# Patient Record
Sex: Male | Born: 1940 | Race: White | Hispanic: No | Marital: Married | Smoking: Former smoker
Health system: Southern US, Community
[De-identification: ages and names within clinical notes are randomized; demographics above are authoritative.]

## PROBLEM LIST (undated history)

## (undated) DIAGNOSIS — I10 Essential (primary) hypertension: Secondary | ICD-10-CM

## (undated) DIAGNOSIS — E119 Type 2 diabetes mellitus without complications: Secondary | ICD-10-CM

## (undated) DIAGNOSIS — C801 Malignant (primary) neoplasm, unspecified: Secondary | ICD-10-CM

## (undated) DIAGNOSIS — E785 Hyperlipidemia, unspecified: Secondary | ICD-10-CM

## (undated) DIAGNOSIS — T7840XA Allergy, unspecified, initial encounter: Secondary | ICD-10-CM

## (undated) HISTORY — PX: CATARACT EXTRACTION, BILATERAL: SHX1313

## (undated) HISTORY — DX: Allergy, unspecified, initial encounter: T78.40XA

## (undated) HISTORY — DX: Essential (primary) hypertension: I10

## (undated) HISTORY — PX: PROSTATE SURGERY: SHX751

## (undated) HISTORY — PX: BLADDER SURGERY: SHX569

## (undated) HISTORY — PX: HERNIA REPAIR: SHX51

## (undated) HISTORY — DX: Hyperlipidemia, unspecified: E78.5

## (undated) HISTORY — DX: Malignant (primary) neoplasm, unspecified: C80.1

## (undated) HISTORY — DX: Type 2 diabetes mellitus without complications: E11.9

---

## 2009-11-12 ENCOUNTER — Ambulatory Visit (HOSPITAL_COMMUNITY): Admission: RE | Admit: 2009-11-12 | Discharge: 2009-11-12 | Payer: Self-pay | Admitting: Urology

## 2009-11-14 ENCOUNTER — Ambulatory Visit: Admission: RE | Admit: 2009-11-14 | Discharge: 2009-11-24 | Payer: Self-pay | Admitting: Radiation Oncology

## 2009-11-25 ENCOUNTER — Ambulatory Visit (HOSPITAL_BASED_OUTPATIENT_CLINIC_OR_DEPARTMENT_OTHER): Admission: RE | Admit: 2009-11-25 | Discharge: 2009-11-25 | Payer: Self-pay | Admitting: Urology

## 2010-01-05 ENCOUNTER — Inpatient Hospital Stay (HOSPITAL_COMMUNITY): Admission: RE | Admit: 2010-01-05 | Discharge: 2010-01-06 | Payer: Self-pay | Admitting: Urology

## 2010-01-05 ENCOUNTER — Encounter (INDEPENDENT_AMBULATORY_CARE_PROVIDER_SITE_OTHER): Payer: Self-pay | Admitting: Urology

## 2010-03-17 ENCOUNTER — Ambulatory Visit: Admission: RE | Admit: 2010-03-17 | Discharge: 2010-03-17 | Payer: Self-pay | Admitting: Radiation Oncology

## 2010-08-19 ENCOUNTER — Ambulatory Visit (HOSPITAL_COMMUNITY): Admission: RE | Admit: 2010-08-19 | Discharge: 2010-08-19 | Payer: Self-pay | Admitting: General Surgery

## 2011-02-25 LAB — GLUCOSE, CAPILLARY
Glucose-Capillary: 122 mg/dL — ABNORMAL HIGH (ref 70–99)
Glucose-Capillary: 134 mg/dL — ABNORMAL HIGH (ref 70–99)

## 2011-02-26 LAB — DIFFERENTIAL
Basophils Relative: 0 % (ref 0–1)
Eosinophils Absolute: 0.1 10*3/uL (ref 0.0–0.7)
Neutro Abs: 2.8 10*3/uL (ref 1.7–7.7)
Neutrophils Relative %: 55 % (ref 43–77)

## 2011-02-26 LAB — BASIC METABOLIC PANEL
Chloride: 108 mEq/L (ref 96–112)
GFR calc Af Amer: >60 mL/min (ref >60)
GFR calc non Af Amer: >60 mL/min (ref >60)
Potassium: 4 mEq/L (ref 3.5–5.1)
Sodium: 144 mEq/L (ref 135–145)

## 2011-02-26 LAB — CBC
Hemoglobin: 12.2 g/dL — ABNORMAL LOW (ref 13.0–17.0)
MCH: 29 pg (ref 26.0–34.0)
MCHC: 34.1 g/dL (ref 30.0–36.0)
Platelets: 167 10*3/uL (ref 150–400)
RDW: 15.7 % — ABNORMAL HIGH (ref 11.5–15.5)

## 2011-03-01 LAB — BASIC METABOLIC PANEL
BUN: 14 mg/dL (ref 6–23)
CO2: 32 mEq/L (ref 19–32)
Calcium: 8.9 mg/dL (ref 8.4–10.5)
Chloride: 105 mEq/L (ref 96–112)
Creatinine, Ser: 1.2 mg/dL (ref 0.4–1.5)
Glucose, Bld: 88 mg/dL (ref 70–99)

## 2011-03-01 LAB — CBC
Hemoglobin: 12.8 g/dL — ABNORMAL LOW (ref 13.0–17.0)
RBC: 4.34 MIL/uL (ref 4.22–5.81)
WBC: 5.8 10*3/uL (ref 4.0–10.5)

## 2011-03-01 LAB — GLUCOSE, CAPILLARY
Glucose-Capillary: 121 mg/dL — ABNORMAL HIGH (ref 70–99)
Glucose-Capillary: 135 mg/dL — ABNORMAL HIGH (ref 70–99)

## 2011-03-01 LAB — HEMOGLOBIN AND HEMATOCRIT, BLOOD
HCT: 31.8 % — ABNORMAL LOW (ref 39.0–52.0)
HCT: 33.5 % — ABNORMAL LOW (ref 39.0–52.0)
Hemoglobin: 11.3 g/dL — ABNORMAL LOW (ref 13.0–17.0)

## 2011-03-01 LAB — TYPE AND SCREEN: Antibody Screen: NEGATIVE

## 2011-03-01 LAB — ABO/RH: ABO/RH(D): O NEG

## 2011-03-16 LAB — GLUCOSE, CAPILLARY: Glucose-Capillary: 112 mg/dL — ABNORMAL HIGH (ref 70–99)

## 2011-03-16 LAB — POCT I-STAT 4, (NA,K, GLUC, HGB,HCT)
HCT: 40 % (ref 39.0–52.0)
Hemoglobin: 13.6 g/dL (ref 13.0–17.0)
Sodium: 144 mEq/L (ref 135–145)

## 2011-05-12 ENCOUNTER — Other Ambulatory Visit (HOSPITAL_COMMUNITY): Payer: Self-pay | Admitting: Urology

## 2011-05-12 ENCOUNTER — Other Ambulatory Visit: Payer: Self-pay | Admitting: Urology

## 2011-05-12 ENCOUNTER — Ambulatory Visit (HOSPITAL_COMMUNITY)
Admission: RE | Admit: 2011-05-12 | Discharge: 2011-05-12 | Disposition: A | Discharge status: Home / Self Care | Payer: Medicare Other | Source: Ambulatory Visit | Attending: Urology | Admitting: Urology

## 2011-05-12 ENCOUNTER — Encounter (HOSPITAL_COMMUNITY): Payer: Medicare Other

## 2011-05-12 DIAGNOSIS — Z01810 Encounter for preprocedural cardiovascular examination: Secondary | ICD-10-CM | POA: Insufficient documentation

## 2011-05-12 DIAGNOSIS — Z01818 Encounter for other preprocedural examination: Secondary | ICD-10-CM | POA: Insufficient documentation

## 2011-05-12 DIAGNOSIS — Z01812 Encounter for preprocedural laboratory examination: Secondary | ICD-10-CM | POA: Insufficient documentation

## 2011-05-12 LAB — CBC
HCT: 39.1 % (ref 39.0–52.0)
Hemoglobin: 12.7 g/dL — ABNORMAL LOW (ref 13.0–17.0)
MCV: 88.3 fL (ref 78.0–100.0)
RBC: 4.43 MIL/uL (ref 4.22–5.81)
RDW: 13.9 % (ref 11.5–15.5)
WBC: 5 10*3/uL (ref 4.0–10.5)

## 2011-05-12 LAB — BASIC METABOLIC PANEL
BUN: 21 mg/dL (ref 6–23)
CO2: 29 mEq/L (ref 19–32)
Chloride: 106 mEq/L (ref 96–112)
GFR calc non Af Amer: 56 mL/min — ABNORMAL LOW (ref >60)
Glucose, Bld: 105 mg/dL — ABNORMAL HIGH (ref 70–99)
Potassium: 4.7 mEq/L (ref 3.5–5.1)
Sodium: 141 mEq/L (ref 135–145)

## 2011-05-13 ENCOUNTER — Other Ambulatory Visit: Payer: Self-pay | Admitting: Urology

## 2011-05-13 ENCOUNTER — Ambulatory Visit (HOSPITAL_COMMUNITY)
Admission: RE | Admit: 2011-05-13 | Discharge: 2011-05-13 | Disposition: A | Discharge status: Home / Self Care | Payer: Medicare Other | Source: Ambulatory Visit | Attending: Urology | Admitting: Urology

## 2011-05-13 DIAGNOSIS — I10 Essential (primary) hypertension: Secondary | ICD-10-CM | POA: Insufficient documentation

## 2011-05-13 DIAGNOSIS — Z7982 Long term (current) use of aspirin: Secondary | ICD-10-CM | POA: Insufficient documentation

## 2011-05-13 DIAGNOSIS — E119 Type 2 diabetes mellitus without complications: Secondary | ICD-10-CM | POA: Insufficient documentation

## 2011-05-13 DIAGNOSIS — E7800 Pure hypercholesterolemia, unspecified: Secondary | ICD-10-CM | POA: Insufficient documentation

## 2011-05-13 DIAGNOSIS — C61 Malignant neoplasm of prostate: Secondary | ICD-10-CM | POA: Insufficient documentation

## 2011-05-13 DIAGNOSIS — R978 Other abnormal tumor markers: Secondary | ICD-10-CM | POA: Insufficient documentation

## 2011-05-13 DIAGNOSIS — Z8551 Personal history of malignant neoplasm of bladder: Secondary | ICD-10-CM | POA: Insufficient documentation

## 2011-05-13 DIAGNOSIS — Z79899 Other long term (current) drug therapy: Secondary | ICD-10-CM | POA: Insufficient documentation

## 2011-05-13 LAB — GLUCOSE, CAPILLARY
Glucose-Capillary: 131 mg/dL — ABNORMAL HIGH (ref 70–99)
Glucose-Capillary: 126 mg/dL — ABNORMAL HIGH (ref 70–99)

## 2011-05-17 NOTE — Op Note (Signed)
Isaac Stewart, Isaac Stewart              ACCOUNT NO.:  [id number]  MEDICAL RECORD NO.:  [id number]           PATIENT TYPE:  O  LOCATION:  DAYL                         FACILITY:  [facility]  PHYSICIAN:  Raynelle Bring, MD      DATE OF BIRTH:  04-Apr-1941  DATE OF PROCEDURE:  05/13/2011 DATE OF DISCHARGE:  05/13/2011                              OPERATIVE REPORT   PREOPERATIVE DIAGNOSES: 1. History of urothelial carcinoma of the bladder. 2. Positive urine tumor markers.  POSTOPERATIVE DIAGNOSES: 1. History of urothelial carcinoma of the bladder. 2. Positive urine tumor markers.  PROCEDURES PERFORMED: 1. Cystoscopy. 2. Bilateral retrograde pyelography with interpretation. 3. Site selected bladder biopsies.  INTRAOPERATIVE FINDINGS:  Bilateral retrograde pyelograms demonstrate no evidence of any filling defects within the renal collecting systems bilaterally or the ureters bilaterally.  There is no evidence of dilation or other abnormalities.  SURGEON:  Raynelle Bring, M.D.  ASSISTANT:  None.  ANESTHESIA:  General.  COMPLICATIONS:  None.  ESTIMATED BLOOD LOSS:  Minimal.  SPECIMENS: 1. Left bladder biopsy. 2. Posterior bladder biopsy. 3. Right bladder biopsy.  DISPOSITION OF SPECIMENS:  Pathology.  INDICATION:  Isaac Stewart is a 70 year old gentleman with a history of urothelial carcinoma of the bladder.  He recently presented for cystoscopic surveillance and was found to have an unremarkable cystoscopy; however, his bladder washing was performed and cytologic analysis revealed atypical cells and subsequent reflex FISH was positive indicating the possibility of recurrent urothelial carcinoma.  After discussing this finding, it was recommended that he proceed with further endoscopic evaluation and the above procedures.  The potential risks, complications and alternative treatment options were discussed in detail and informed consent obtained.  DESCRIPTION OF PROCEDURE:  The  patient was taken to the operating room and a general anesthetic was administered.  He was given preoperative antibiotics, placed in the dorsal lithotomy position and prepped and draped in the usual sterile fashion.  Next, a preoperative time-out was performed.  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  Inspection of the bladder was then systematically performed with both a 12- and 70-degree lens and revealed no evidence of any bladder tumors, stones or other mucosal pathology.  The ureteral orifices were identified in their expected anatomic locations and the left ureteral orifice was intubated with a 6-French ureteral catheter with Omnipaque contrast injected.  An identical procedure was performed on the contralateral side with findings dictated as above.  In summary, no abnormalities were noted in the renal collecting system or ureters bilaterally.  Attention then returned to the bladder and site selected biopsies were performed from the right lateral, posterior and left lateral bladder walls.  This was performed using a cold cup biopsy forceps.  The sites were then fulgurated with a Bugbee electrode and the bladder was emptied and reinspected.  Hemostasis remained excellent. The patient's bladder was then emptied and the procedure was ended.  He tolerated the procedure well without complications.  He was able to be transferred to the recovery unit in satisfactory condition.  Raynelle Bring, MD     LB/MEDQ  D:  05/13/2011  T:  05/13/2011  Job:  [id number]  Electronically Signed by Raynelle Bring MD on 05/17/2011 08:09:34 PM

## 2013-05-19 DIAGNOSIS — H66009 Acute suppurative otitis media without spontaneous rupture of ear drum, unspecified ear: Secondary | ICD-10-CM | POA: Insufficient documentation

## 2013-09-11 DIAGNOSIS — E782 Mixed hyperlipidemia: Secondary | ICD-10-CM | POA: Insufficient documentation

## 2013-09-11 DIAGNOSIS — E1122 Type 2 diabetes mellitus with diabetic chronic kidney disease: Secondary | ICD-10-CM | POA: Insufficient documentation

## 2014-08-06 ENCOUNTER — Ambulatory Visit (INDEPENDENT_AMBULATORY_CARE_PROVIDER_SITE_OTHER): Payer: Medicare Other | Admitting: Cardiovascular Disease

## 2014-08-06 ENCOUNTER — Encounter: Payer: Self-pay | Admitting: Cardiovascular Disease

## 2014-08-06 VITALS — BP 154/68 | HR 79 | Ht 72.0 in | Wt 210.0 lb

## 2014-08-06 DIAGNOSIS — I10 Essential (primary) hypertension: Secondary | ICD-10-CM

## 2014-08-06 NOTE — Patient Instructions (Signed)
Dr Gwenlyn Found will follow-up with you on an as needed basis.

## 2014-08-06 NOTE — Progress Notes (Signed)
Patient ID: Isaac Stewart, male   DOB: 1941/12/11, 73 y.o.   MRN: [id number]    OFFICE NOTE  Chief Complaint:  New patient visit for abnormal EKG  Primary Care Physician: Anselmo Pickler, DO  HPI:  Isaac Stewart is a 73 yo male with HTN, HLD, and diabetes type 2. Presents today for evaluation of abnormal EKG at primary care doctor. Was seen in there office about a month ago for his yearly physical exam and had an EKG that he was told did not have P waves. No chest pain, dyspnea, orthopnea, PND, light headedness, syncope, muscle aches, palpitations, nausea, vomiting, diarrhea, abdominal pain, numbness, or weakness. Does not check blood pressure at home. Last checked cholesterol and A1c one month ago. Was told these values were okay. Patient notes no history of MI or CAD or stroke. No prior stress tests or cardiac catheterizations. 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 No trouble with current activities.  States brother told him he stops breathing at night and he snores as well. No prior evaluation for sleep apnea. Notes he gets a good night sleep and feels well rested in the morning. Does not feel somnolent in the afternoon. Epworth sleepiness scale score of 5.    PMHx:  Past Medical History  Diagnosis Date  . HTN (hypertension)   . Diabetes     oral medications  . Hyperlipemia     Past Surgical History  Procedure Laterality Date  . Hernia repair    . Bladder surgery    . Prostate surgery      FAMHx:  Family History  Problem Relation Age of Onset  . Cancer Father   . Heart disease Maternal Grandfather     SOCHx:   reports that he quit smoking about 13 years ago. He does not have any smokeless tobacco history on file. He reports that he drinks alcohol. His drug history is not on file.  ALLERGIES:  No Known Allergies  ROS: A comprehensive review of systems was negative except for: apneic episodes at night  HOME MEDS: Current Outpatient Prescriptions    Medication Sig Dispense Refill  . amLODipine (NORVASC) 5 MG tablet Take 5 mg by mouth daily.      . APPLE CIDER VINEGAR PO Take 500 mg by mouth daily.      . fenofibrate 160 MG tablet Take 160 mg by mouth daily.      Marland Kitchen glimepiride (AMARYL) 2 MG tablet Take 2 mg by mouth daily.      Marland Kitchen loratadine (CLARITIN) 10 MG tablet Take 20 mg by mouth daily.      . metFORMIN (GLUCOPHAGE) 500 MG tablet Take 1,000 mg by mouth daily.      . Multiple Vitamin (MULTIVITAMIN) capsule Take 1 capsule by mouth daily.      . Omega-3 Fatty Acids (OMEGA-3 FISH OIL) 300 MG CAPS Take 500 mg by mouth daily.      . simvastatin (ZOCOR) 40 MG tablet Take 40 mg by mouth daily.       No current facility-administered medications for this visit.    LABS/IMAGING: No results found for this or any previous visit (from the past 48 hour(s)). No results found.  VITALS: BP 154/68  Pulse 79  Ht 6' (1.829 m)  Wt 210 lb (95.255 kg)  BMI 28.47 kg/m2  EXAM: General appearance: alert, cooperative and no distress Neck: no adenopathy, no carotid bruit, no JVD and supple, symmetrical, trachea midline Lungs: clear to auscultation bilaterally Heart:  regular rate and rhythm, S1, S2 normal, no murmur, click, rub or gallop Abdomen: soft, non-tender; bowel sounds normal; no masses,  no organomegaly Extremities: extremities normal, atraumatic, no cyanosis or edema Pulses: 2+ and symmetric radial pulses Skin: Skin color, texture, turgor normal. No rashes or lesions Neurologic: Grossly normal  EKG: NSR, rate 79  ASSESSMENT: 1. Normal EKG on previous assessment at PCP and today- no CAD or family history of MI or stroke. Risk factor management is reportedly under good control. 2. Hypertension- controlled 3. Reported apneic episodes-  No prior work up, though does not note any symptoms related to this.  PLAN: 1.   Patient with normal EKG today in the office. No need for further follow-up unless he develops cardiac symptoms. Follow-up  prn. 2.   Will continue amlodipine 5 mg daily for blood pressure control. 3.   Patient with no symptoms of sleep apnea. If he develops symptoms in the future he could get a sleep study through his PCP.    Tommi Rumps 08/06/2014, 11:10 AM   Agree with note by Dr. Caryl Bis.  The patient was referred for an abnormal EKG. In review of his EKG performed by his PCP and the one performed here they appear essentially normal. He does have well-controlled hypertension on amlodipine. His exam is benign. We will see him back when necessary  Lorretta Harp, M.D., [place], [facility], [place], [place] [address] [place], [place]  [id number]  [phone] 08/06/2014 12:12 PM

## 2014-08-12 ENCOUNTER — Encounter: Payer: Self-pay | Admitting: Cardiovascular Disease

## 2014-11-14 ENCOUNTER — Other Ambulatory Visit: Payer: Self-pay | Admitting: Nephrology

## 2014-11-14 DIAGNOSIS — I158 Other secondary hypertension: Secondary | ICD-10-CM

## 2014-11-14 DIAGNOSIS — N183 Chronic kidney disease, stage 3 unspecified: Secondary | ICD-10-CM

## 2014-11-25 ENCOUNTER — Ambulatory Visit
Admission: RE | Admit: 2014-11-25 | Discharge: 2014-11-25 | Disposition: A | Discharge status: Home / Self Care | Payer: Medicare Other | Source: Ambulatory Visit | Attending: Nephrology | Admitting: Nephrology

## 2014-11-25 DIAGNOSIS — N183 Chronic kidney disease, stage 3 unspecified: Secondary | ICD-10-CM

## 2014-11-25 DIAGNOSIS — I158 Other secondary hypertension: Secondary | ICD-10-CM

## 2017-01-07 DIAGNOSIS — C61 Malignant neoplasm of prostate: Secondary | ICD-10-CM | POA: Diagnosis not present

## 2017-01-14 DIAGNOSIS — Z8551 Personal history of malignant neoplasm of bladder: Secondary | ICD-10-CM | POA: Diagnosis not present

## 2017-01-14 DIAGNOSIS — Z8546 Personal history of malignant neoplasm of prostate: Secondary | ICD-10-CM | POA: Diagnosis not present

## 2017-05-25 DIAGNOSIS — E785 Hyperlipidemia, unspecified: Secondary | ICD-10-CM | POA: Diagnosis not present

## 2017-05-25 DIAGNOSIS — N183 Chronic kidney disease, stage 3 (moderate): Secondary | ICD-10-CM | POA: Diagnosis not present

## 2017-05-25 DIAGNOSIS — Z794 Long term (current) use of insulin: Secondary | ICD-10-CM | POA: Diagnosis not present

## 2017-05-25 DIAGNOSIS — I10 Essential (primary) hypertension: Secondary | ICD-10-CM | POA: Diagnosis not present

## 2017-05-25 DIAGNOSIS — E119 Type 2 diabetes mellitus without complications: Secondary | ICD-10-CM | POA: Diagnosis not present

## 2017-10-06 DIAGNOSIS — R69 Illness, unspecified: Secondary | ICD-10-CM | POA: Diagnosis not present

## 2017-10-17 DIAGNOSIS — R69 Illness, unspecified: Secondary | ICD-10-CM | POA: Diagnosis not present

## 2017-12-22 DIAGNOSIS — L989 Disorder of the skin and subcutaneous tissue, unspecified: Secondary | ICD-10-CM | POA: Diagnosis not present

## 2017-12-22 DIAGNOSIS — E785 Hyperlipidemia, unspecified: Secondary | ICD-10-CM | POA: Diagnosis not present

## 2017-12-22 DIAGNOSIS — E559 Vitamin D deficiency, unspecified: Secondary | ICD-10-CM | POA: Diagnosis not present

## 2017-12-22 DIAGNOSIS — I10 Essential (primary) hypertension: Secondary | ICD-10-CM | POA: Diagnosis not present

## 2017-12-22 DIAGNOSIS — E119 Type 2 diabetes mellitus without complications: Secondary | ICD-10-CM | POA: Diagnosis not present

## 2017-12-22 DIAGNOSIS — Z794 Long term (current) use of insulin: Secondary | ICD-10-CM | POA: Diagnosis not present

## 2018-01-25 DIAGNOSIS — Z8546 Personal history of malignant neoplasm of prostate: Secondary | ICD-10-CM | POA: Diagnosis not present

## 2018-02-01 DIAGNOSIS — Z8546 Personal history of malignant neoplasm of prostate: Secondary | ICD-10-CM | POA: Diagnosis not present

## 2018-02-01 DIAGNOSIS — Z8551 Personal history of malignant neoplasm of bladder: Secondary | ICD-10-CM | POA: Diagnosis not present

## 2018-03-13 DIAGNOSIS — E119 Type 2 diabetes mellitus without complications: Secondary | ICD-10-CM | POA: Diagnosis not present

## 2018-04-05 DIAGNOSIS — L821 Other seborrheic keratosis: Secondary | ICD-10-CM | POA: Diagnosis not present

## 2018-04-05 DIAGNOSIS — D0439 Carcinoma in situ of skin of other parts of face: Secondary | ICD-10-CM | POA: Diagnosis not present

## 2018-04-05 DIAGNOSIS — D0462 Carcinoma in situ of skin of left upper limb, including shoulder: Secondary | ICD-10-CM | POA: Diagnosis not present

## 2018-04-05 DIAGNOSIS — L570 Actinic keratosis: Secondary | ICD-10-CM | POA: Diagnosis not present

## 2018-04-05 DIAGNOSIS — X32XXXA Exposure to sunlight, initial encounter: Secondary | ICD-10-CM | POA: Diagnosis not present

## 2018-05-10 DIAGNOSIS — L570 Actinic keratosis: Secondary | ICD-10-CM | POA: Diagnosis not present

## 2018-05-10 DIAGNOSIS — Z08 Encounter for follow-up examination after completed treatment for malignant neoplasm: Secondary | ICD-10-CM | POA: Diagnosis not present

## 2018-05-10 DIAGNOSIS — Z85828 Personal history of other malignant neoplasm of skin: Secondary | ICD-10-CM | POA: Diagnosis not present

## 2018-05-10 DIAGNOSIS — X32XXXD Exposure to sunlight, subsequent encounter: Secondary | ICD-10-CM | POA: Diagnosis not present

## 2018-06-22 DIAGNOSIS — E785 Hyperlipidemia, unspecified: Secondary | ICD-10-CM | POA: Diagnosis not present

## 2018-06-22 DIAGNOSIS — E119 Type 2 diabetes mellitus without complications: Secondary | ICD-10-CM | POA: Diagnosis not present

## 2018-06-22 DIAGNOSIS — Z794 Long term (current) use of insulin: Secondary | ICD-10-CM | POA: Diagnosis not present

## 2018-06-22 DIAGNOSIS — Z23 Encounter for immunization: Secondary | ICD-10-CM | POA: Diagnosis not present

## 2018-06-22 DIAGNOSIS — E1121 Type 2 diabetes mellitus with diabetic nephropathy: Secondary | ICD-10-CM | POA: Diagnosis not present

## 2018-06-22 DIAGNOSIS — I10 Essential (primary) hypertension: Secondary | ICD-10-CM | POA: Diagnosis not present

## 2018-08-01 DIAGNOSIS — Z8546 Personal history of malignant neoplasm of prostate: Secondary | ICD-10-CM | POA: Diagnosis not present

## 2018-10-11 DIAGNOSIS — R69 Illness, unspecified: Secondary | ICD-10-CM | POA: Diagnosis not present

## 2018-10-18 DIAGNOSIS — D0462 Carcinoma in situ of skin of left upper limb, including shoulder: Secondary | ICD-10-CM | POA: Diagnosis not present

## 2018-10-23 DIAGNOSIS — R197 Diarrhea, unspecified: Secondary | ICD-10-CM | POA: Diagnosis not present

## 2018-10-23 DIAGNOSIS — Z1211 Encounter for screening for malignant neoplasm of colon: Secondary | ICD-10-CM | POA: Diagnosis not present

## 2018-11-14 ENCOUNTER — Encounter: Payer: Self-pay | Admitting: Gastroenterology

## 2018-12-04 ENCOUNTER — Other Ambulatory Visit: Payer: Self-pay

## 2018-12-04 ENCOUNTER — Encounter: Payer: Self-pay | Admitting: Gastroenterology

## 2018-12-04 ENCOUNTER — Ambulatory Visit (AMBULATORY_SURGERY_CENTER): Payer: Self-pay | Admitting: *Deleted

## 2018-12-04 VITALS — Ht 72.0 in | Wt 219.5 lb

## 2018-12-04 DIAGNOSIS — R195 Other fecal abnormalities: Secondary | ICD-10-CM

## 2018-12-04 MED ORDER — SUPREP BOWEL PREP KIT 17.5-3.13-1.6 GM/177ML PO SOLN: 1.0000 | Freq: Once | ORAL | 0 refills | Status: AC

## 2018-12-04 NOTE — Progress Notes (Signed)
No egg or soy allergy known to patient  No issues with past sedation with any surgeries  or procedures, no intubation problems  No diet pills per patient No home 02 use per patient  No blood thinners per patient  Pt denies issues with constipation  No A fib or A flutter  EMMI video sent to pt's e mail  

## 2018-12-18 ENCOUNTER — Encounter: Payer: Self-pay | Admitting: Gastroenterology

## 2018-12-18 ENCOUNTER — Ambulatory Visit (AMBULATORY_SURGERY_CENTER): Payer: Medicare HMO | Admitting: Gastroenterology

## 2018-12-18 VITALS — BP 166/88 | HR 69 | Resp 11

## 2018-12-18 DIAGNOSIS — D123 Benign neoplasm of transverse colon: Secondary | ICD-10-CM | POA: Diagnosis not present

## 2018-12-18 DIAGNOSIS — R195 Other fecal abnormalities: Secondary | ICD-10-CM

## 2018-12-18 DIAGNOSIS — D120 Benign neoplasm of cecum: Secondary | ICD-10-CM | POA: Diagnosis not present

## 2018-12-18 DIAGNOSIS — D125 Benign neoplasm of sigmoid colon: Secondary | ICD-10-CM

## 2018-12-18 MED ORDER — SODIUM CHLORIDE 0.9 % IV SOLN: 500.0000 mL | Freq: Once | INTRAVENOUS | Status: DC

## 2018-12-18 NOTE — Progress Notes (Signed)
Called to room to assist during endoscopic procedure.  Patient ID and intended procedure confirmed with present staff. Received instructions for my participation in the procedure from the performing physician.  

## 2018-12-18 NOTE — Op Note (Signed)
[place] [facility] Patient Name: Cass Vandermeulen Procedure Date: 12/18/2018 8:39 AM MRN: [id number] Endoscopist: [place]. Loletha Carrow , MD Age: 78 Referring MD:  Date of Birth: [birth year] Gender: Male Account #: [id number] Procedure:                Colonoscopy Indications:              Heme positive stool Medicines:                Monitored Anesthesia Care Procedure:                Pre-Anesthesia Assessment:                           - Prior to the procedure, a History and Physical                            was performed, and patient medications and                            allergies were reviewed. The patient's tolerance of                            previous anesthesia was also reviewed. The risks                            and benefits of the procedure and the sedation                            options and risks were discussed with the patient.                            All questions were answered, and informed consent                            was obtained. Prior Anticoagulants: The patient has                            taken no previous anticoagulant or antiplatelet                            agents. ASA Grade Assessment: II - A patient with                            mild systemic disease. After reviewing the risks                            and benefits, the patient was deemed in                            satisfactory condition to undergo the procedure.                           After obtaining informed consent, the colonoscope  was passed under direct vision. Throughout the                            procedure, the patient's blood pressure, pulse, and                            oxygen saturations were monitored continuously. The                            Model CF-HQ190L [id number]) scope was introduced                            through the anus and advanced to the the cecum,                            identified by appendiceal orifice and  ileocecal                            valve. The colonoscopy was performed without                            difficulty. The patient tolerated the procedure                            well. The quality of the bowel preparation was                            fair. The ileocecal valve, appendiceal orifice, and                            rectum were photographed. Scope In: 8:48:39 AM Scope Out: 9:30:15 AM Scope Withdrawal Time: 0 hours 36 minutes 38 seconds  Total Procedure Duration: 0 hours 41 minutes 36 seconds  Findings:                 A large skin tags were found on perianal exam.                           A 12 mm polyp was found in the cecum. The polyp was                            sessile. The polyp was removed with a piecemeal                            technique using a cold snare. Resection and                            retrieval were complete. (Jar 1)                           A 6 mm polyp was found in the mid transverse colon.                            The  polyp was flat. The polyp was removed with a                            piecemeal technique using a cold biopsy forceps.                            Resection and retrieval were complete. (Jar 2)                           Three sessile polyps were found in the distal                            transverse colon. The polyps were 5 to 10 mm in                            size. Two of these polyps were removed with a cold                            snare, and one removed with hot snare. Resection                            and retrieval were complete. (Jar 3)                           A partially obstructing mass was found in the                            sigmoid colon. The mass was partially                            circumferential (involving one-half of the lumen                            circumference). The mass measured approximately                            five cm in length (24-30cm from anal verge). This                             was biopsied with a cold forceps for histology.                            Area was tattooed with Spot (carbon black)- two 0.5                            ml injections on opposite walls about 3-5cm                            proximal to pass, and two 0.5/1.66ml injections on                            opposite walls about 3-5cm distal to the mass.  Internal hemorrhoids were found.                           The exam was otherwise without abnormality on                            direct and retroflexion views. Complications:            No immediate complications. Estimated Blood Loss:     Estimated blood loss was minimal. Impression:               - Preparation of the colon was fair.                           - Perianal skin tags found on perianal exam.                           - One 12 mm polyp in the cecum, removed piecemeal                            using a cold snare. Resected and retrieved.                           - One 6 mm polyp in the mid transverse colon,                            removed piecemeal using a cold biopsy forceps.                            Resected and retrieved.                           - Three 5 to 10 mm polyps in the distal transverse                            colon, removed with a hot snare. Resected and                            retrieved.                           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 Biopsied. Tattooed.                           - Internal hemorrhoids.                           - The examination was otherwise normal on direct                            and retroflexion views. Recommendation:           - Patient has a contact number available for  emergencies. The signs and symptoms of potential                            delayed complications were discussed with the                            patient. Return to  normal activities tomorrow.                            Written discharge instructions were provided to the                            patient.                           - Resume previous diet.                           - Continue present medications.                           - Await pathology results.                           - Repeat colonoscopy is recommended for                            surveillance. The colonoscopy date will be                            determined after pathology results from today's                            exam become available for review. Henry L. Loletha Carrow, MD 12/18/2018 9:41:25 AM This report has been signed electronically.

## 2018-12-18 NOTE — Progress Notes (Signed)
Pt. Awake, alert, denies any symptoms of low BS.  4 more oz. OJ, Atkins protein bar consumed in entirety, and PB on graham crackers.

## 2018-12-18 NOTE — Progress Notes (Signed)
Pt. Awake, alert, asymptomatic, BS 119.

## 2018-12-18 NOTE — Progress Notes (Signed)
To PACU, VSS, Report to [email or web address].tb

## 2018-12-18 NOTE — Patient Instructions (Addendum)
Impression/Recommendations:  Polyp handout given to patient. Hemorrhoid handout given to patient.  Resume previous diet. Continue present medications.  Repeat colonoscopy recommended for surveillance.   Date to be determined after pathology results reviewed.  YOU HAD AN ENDOSCOPIC PROCEDURE TODAY AT [facility]:   Refer to the procedure report that was given to you for any specific questions about what was found during the examination.  If the procedure report does not answer your questions, please call your gastroenterologist to clarify.  If you requested that your care partner not be given the details of your procedure findings, then the procedure report has been included in a sealed envelope for you to review at your convenience later.  YOU SHOULD EXPECT: Some feelings of bloating in the abdomen. Passage of more gas than usual.  Walking can help get rid of the air that was put into your GI tract during the procedure and reduce the bloating. If you had a lower endoscopy (such as a colonoscopy or flexible sigmoidoscopy) you may notice spotting of blood in your stool or on the toilet paper. If you underwent a bowel prep for your procedure, you may not have a normal bowel movement for a few days.  Please Note:  You might notice some irritation and congestion in your nose or some drainage.  This is from the oxygen used during your procedure.  There is no need for concern and it should clear up in a day or so.  SYMPTOMS TO REPORT IMMEDIATELY:   Following lower endoscopy (colonoscopy or flexible sigmoidoscopy):  Excessive amounts of blood in the stool  Significant tenderness or worsening of abdominal pains  Swelling of the abdomen that is new, acute  Fever of 100F or higher For urgent or emergent issues, a gastroenterologist can be reached at any hour by calling [phone].   DIET:  We do recommend a small meal at first, but then you may proceed to your regular diet.   Drink plenty of fluids but you should avoid alcoholic beverages for 24 hours.  ACTIVITY:  You should plan to take it easy for the rest of today and you should NOT DRIVE or use heavy machinery until tomorrow (because of the sedation medicines used during the test).    FOLLOW UP: Our staff will call the number listed on your records the next business day following your procedure to check on you and address any questions or concerns that you may have regarding the information given to you following your procedure. If we do not reach you, we will leave a message.  However, if you are feeling well and you are not experiencing any problems, there is no need to return our call.  We will assume that you have returned to your regular daily activities without incident.  If any biopsies were taken you will be contacted by phone or by letter within the next 1-3 weeks.  Please call us at [phone] if you have not heard about the biopsies in 3 weeks.    SIGNATURES/CONFIDENTIALITY: You and/or your care partner have signed paperwork which will be entered into your electronic medical record.  These signatures attest to the fact that that the information above on your After Visit Summary has been reviewed and is understood.  Full responsibility of the confidentiality of this discharge information lies with you and/or your care-partner.  ____________________________________________________________________________________________________________________________________   Dennis Bast have been scheduled for a CT scan of the abdomen and pelvis at [facility] ([address]---this is in the same building as Press [facility]).   You are scheduled on __________________ at _____________. You should arrive 15 minutes prior to your appointment time for registration. Please follow the written instructions below on the day of your exam:  WARNING: IF YOU ARE ALLERGIC TO IODINE/X-RAY DYE, PLEASE NOTIFY RADIOLOGY  IMMEDIATELY AT [phone]! YOU WILL BE GIVEN A 13 HOUR PREMEDICATION PREP.  1) Do not eat or drink anything after ______________ (4 hours prior to your test) 2) You have been given 2 bottles of oral contrast to drink. The solution may taste better if refrigerated, but do NOT add ice or any other liquid to this solution. Shake well before drinking.    Drink 1 bottle of contrast @ __________ (2 hours prior to your exam)  Drink 1 bottle of contrast @ __________ (1 hour prior to your exam)  You may take any medications as prescribed with a small amount of water, if necessary. If you take any of the following medications: METFORMIN, GLUCOPHAGE, GLUCOVANCE, AVANDAMET, RIOMET, FORTAMET, [place] MET, JANUMET, GLUMETZA or METAGLIP, you MAY be asked to HOLD this medication 48 hours AFTER the exam.  The purpose of you drinking the oral contrast is to aid in the visualization of your intestinal tract. The contrast solution may cause some diarrhea. Depending on your individual set of symptoms, you may also receive an intravenous injection of x-ray contrast/dye. Plan on being at [facility] for 30 minutes or longer, depending on the type of exam you are having performed.  This test typically takes 30-45 minutes to complete.  If you have any questions regarding your exam or if you need to reschedule, you may call the CT department at [phone] between the hours of 8:00 am and 5:00 pm, Monday-Friday.  ________________________________________________________________________

## 2018-12-19 ENCOUNTER — Telehealth: Payer: Self-pay

## 2018-12-19 NOTE — Telephone Encounter (Signed)
  Follow up Call-  Call back number 12/18/2018  Post procedure Call Back phone  # [phone]  Permission to leave phone message Yes  Some recent data might be hidden     Patient questions:  Do you have a fever, pain , or abdominal swelling? No. Pain Score  0 *  Have you tolerated food without any problems? Yes.    Have you been able to return to your normal activities? Yes.    Do you have any questions about your discharge instructions: Diet   No. Medications  No. Follow up visit  No.  Do you have questions or concerns about your Care? No.  Actions: * If pain score is 4 or above: No action needed, pain <4.

## 2018-12-19 NOTE — Telephone Encounter (Signed)
Call report from Dr. Saralyn Pilar with pathology cell # [phone].  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

## 2018-12-20 ENCOUNTER — Other Ambulatory Visit: Payer: Self-pay

## 2018-12-20 DIAGNOSIS — Z85828 Personal history of other malignant neoplasm of skin: Secondary | ICD-10-CM | POA: Diagnosis not present

## 2018-12-20 DIAGNOSIS — C189 Malignant neoplasm of colon, unspecified: Secondary | ICD-10-CM

## 2018-12-20 DIAGNOSIS — Z08 Encounter for follow-up examination after completed treatment for malignant neoplasm: Secondary | ICD-10-CM | POA: Diagnosis not present

## 2018-12-21 ENCOUNTER — Other Ambulatory Visit (INDEPENDENT_AMBULATORY_CARE_PROVIDER_SITE_OTHER): Payer: Medicare HMO

## 2018-12-21 DIAGNOSIS — C189 Malignant neoplasm of colon, unspecified: Secondary | ICD-10-CM | POA: Diagnosis not present

## 2018-12-21 LAB — COMPREHENSIVE METABOLIC PANEL
ALT: 16 U/L (ref 0–53)
AST: 13 U/L (ref 0–37)
Albumin: 3.7 g/dL (ref 3.5–5.2)
Alkaline Phosphatase: 45 U/L (ref 39–117)
BUN: 26 mg/dL — ABNORMAL HIGH (ref 6–23)
CO2: 29 mEq/L (ref 19–32)
Calcium: 9 mg/dL (ref 8.4–10.5)
Chloride: 105 mEq/L (ref 96–112)
Creatinine, Ser: 1.82 mg/dL — ABNORMAL HIGH (ref 0.40–1.50)
GFR: 38.67 mL/min — AB (ref >60.00)
Glucose, Bld: 201 mg/dL — ABNORMAL HIGH (ref 70–99)
Potassium: 4.8 mEq/L (ref 3.5–5.1)
Sodium: 139 mEq/L (ref 135–145)
Total Bilirubin: 0.4 mg/dL (ref 0.2–1.2)
Total Protein: 6.4 g/dL (ref 6.0–8.3)

## 2018-12-21 LAB — CBC WITH DIFFERENTIAL/PLATELET
Basophils Absolute: 0.1 10*3/uL (ref 0.0–0.1)
Basophils Relative: 1.3 % (ref 0.0–3.0)
Eosinophils Absolute: 0.4 10*3/uL (ref 0.0–0.7)
Eosinophils Relative: 5.7 % — ABNORMAL HIGH (ref 0.0–5.0)
HCT: 38.6 % — ABNORMAL LOW (ref 39.0–52.0)
Hemoglobin: 13 g/dL (ref 13.0–17.0)
Lymphocytes Relative: 29.4 % (ref 12.0–46.0)
Lymphs Abs: 1.9 10*3/uL (ref 0.7–4.0)
MCHC: 33.8 g/dL (ref 30.0–36.0)
MCV: 86.7 fl (ref 78.0–100.0)
Monocytes Absolute: 0.3 10*3/uL (ref 0.1–1.0)
Monocytes Relative: 5.3 % (ref 3.0–12.0)
Neutro Abs: 3.7 10*3/uL (ref 1.4–7.7)
Neutrophils Relative %: 58.3 % (ref 43.0–77.0)
Platelets: 242 10*3/uL (ref 150.0–400.0)
RBC: 4.45 Mil/uL (ref 4.22–5.81)
RDW: 14.3 % (ref 11.5–15.5)
WBC: 6.4 10*3/uL (ref 4.0–10.5)

## 2018-12-22 ENCOUNTER — Telehealth: Payer: Self-pay

## 2018-12-22 LAB — CEA: CEA: 0.5 ng/mL

## 2018-12-22 NOTE — Telephone Encounter (Signed)
It is a CT scan of the chest, abdomen and pelvis with oral contrast only

## 2018-12-22 NOTE — Telephone Encounter (Signed)
Isaac Rota, do you happen to know if he mentioned to change to without contrast? I'm looking online and not seeing the CPT codes changed.

## 2018-12-22 NOTE — Telephone Encounter (Signed)
-----   Message from Darden Dates sent at 12/22/2018  9:14 AM EST ----- Regarding: CT's pending denial- Need Dr. Loletha Carrow to call Darlina Guys, Need to call [phone] option #4 to set up peer to peer Insurance is pending denial right now for CT abd/pel/chest.  He is scheduled for Monday so will prob need to be r/s. Case# [id number] CPT coded need to be changed too.  XXXX They need to be changed to "without contrast" [id number] Chest WO and [place] while he is on the phone with him.  Marzetta Board just told me from [place].    They are denying because cancer confine to polyps.  I'm sure Dr. Loletha Carrow can clear that up with them. Just let me know where we are.   May need to give him a heads up on whats going on. Thanks a lot! Amy

## 2018-12-22 NOTE — Telephone Encounter (Signed)
Dr Loletha Carrow spoke to Dr Lacretia Nicks with the pre certification dept. Approved #[id number]

## 2018-12-25 ENCOUNTER — Ambulatory Visit (INDEPENDENT_AMBULATORY_CARE_PROVIDER_SITE_OTHER)
Admission: RE | Admit: 2018-12-25 | Discharge: 2018-12-25 | Disposition: A | Discharge status: Home / Self Care | Payer: Medicare HMO | Source: Ambulatory Visit | Attending: Gastroenterology | Admitting: Gastroenterology

## 2018-12-25 DIAGNOSIS — E1121 Type 2 diabetes mellitus with diabetic nephropathy: Secondary | ICD-10-CM | POA: Diagnosis not present

## 2018-12-25 DIAGNOSIS — C189 Malignant neoplasm of colon, unspecified: Secondary | ICD-10-CM | POA: Insufficient documentation

## 2018-12-25 DIAGNOSIS — E119 Type 2 diabetes mellitus without complications: Secondary | ICD-10-CM | POA: Diagnosis not present

## 2018-12-25 DIAGNOSIS — I10 Essential (primary) hypertension: Secondary | ICD-10-CM | POA: Diagnosis not present

## 2018-12-25 DIAGNOSIS — Z794 Long term (current) use of insulin: Secondary | ICD-10-CM | POA: Diagnosis not present

## 2018-12-25 DIAGNOSIS — Z8551 Personal history of malignant neoplasm of bladder: Secondary | ICD-10-CM | POA: Insufficient documentation

## 2018-12-25 DIAGNOSIS — E785 Hyperlipidemia, unspecified: Secondary | ICD-10-CM | POA: Diagnosis not present

## 2018-12-25 DIAGNOSIS — Z8546 Personal history of malignant neoplasm of prostate: Secondary | ICD-10-CM | POA: Insufficient documentation

## 2018-12-25 NOTE — Telephone Encounter (Signed)
This is why I asked about changing codes with [employer] while you had them on the phone on Friday:  Notes recorded by Marlon Pel, RN on 12/21/2018 at 4:24 PM EST I spoke with Erline Levine in [place]. Orders changed to without IV contrast Wife verbalized understanding to continue with orders for oral contrast She verbalized understanding. ------  Notes recorded by Doran Stabler, MD on 12/21/2018 at 1:27 PM EST This patient with newly diagnosed colon cancer has an elevated creatinine,and is schedule for a staging CT scan chest/abdomen/pelvis on 12/25/2018.  Please contact radiology and change order so that this study is done with oral contrast only. No IV contrast due to elevated creatinine.  I will call them this morning and see if they will allow me to change it.  He is scheduled this afternoon.  I will let you know if I have any issues.  Thanks, Amy

## 2018-12-25 NOTE — Telephone Encounter (Signed)
Spoke to [place] in [place]. Approval codes given and order changed to oral contrast only.

## 2018-12-25 NOTE — Telephone Encounter (Signed)
Please attend to this question

## 2018-12-26 DIAGNOSIS — C189 Malignant neoplasm of colon, unspecified: Secondary | ICD-10-CM

## 2019-01-01 ENCOUNTER — Ambulatory Visit: Payer: Self-pay | Admitting: General Surgery

## 2019-01-01 DIAGNOSIS — K6389 Other specified diseases of intestine: Secondary | ICD-10-CM | POA: Diagnosis not present

## 2019-01-02 ENCOUNTER — Ambulatory Visit: Payer: Self-pay | Admitting: General Surgery

## 2019-01-02 NOTE — H&P (Unsigned)
History of Present Illness Isaac Ruff MD; 1/61/0960 10:10 AM) The patient is a 78 year old male who presents with a colonic mass. 78 year old male with uncontrolled hypertension and history of prostate cancer who presents to the office for evaluation of a newly diagnosed colon mass. He was noted to have a positive fecal occult blood test and colonoscopy was performed. This showed multiple polyps as well as a sigmoid colon mass which was biopsied. 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 CEA was normal. CT scan showed no signs of metastatic disease but these were done with out contrast due to an elevated creatinine of 1.8.   Problem List/Past Medical Mammie Lorenzo, LPN; 4/54/0981 1:91 AM) MASS OF COLON [phone])  Past Surgical History Mammie Lorenzo, LPN; 5/62/1308 6:57 AM) Cataract Surgery Bilateral. Colon Polyp Removal - Colonoscopy Prostate Surgery - Removal Vasectomy  Diagnostic Studies History Mammie Lorenzo, LPN; 8/46/9629 5:28 AM) Colonoscopy within last year  Allergies ([place], CMA; 01/01/2019 9:47 AM) No Known Allergies [01/01/2019]: No Known Drug Allergies [01/01/2019]: Allergies Reconciled  Medication History Isaac Ruff, MD; 03/26/2439 10:12 AM) Aspirin (81MG  Tablet Chewable, Oral) Active. Simvastatin (40MG  Tablet, Oral) Active. NIFEdipine ER (90MG  Tablet ER 24HR, Oral) Active. Glimepiride (4MG  Tablet, Oral) Active. Tyler Aas FlexTouch (200UNIT/ML Soln Pen-inj, Subcutaneous) Active. Vitamin D3 (25MCG Tablet, Oral) Active. Apple Cider Vinegar (188MG  Capsule, Oral) Active. MegaRed Omega-3 Krill Oil (500MG  Capsule, Oral) Active. EQL Cinnamon (500MG  Capsule, Oral) Active. Medications Reconciled Neomycin Sulfate (500MG  Tablet, 2 (two) Oral SEE NOTE, Taken starting 01/01/2019) Active. (TAKE TWO TABLETS AT 2 PM, 3 PM, AND 10 PM THE DAY PRIOR TO SURGERY) Flagyl (500MG  Tablet, 2 (two) Oral SEE NOTE, Taken  starting 01/01/2019) Active. (Take at 2pm, 3pm, and 10pm the day prior to your colon operation)  Social History Mammie Lorenzo, LPN; 12/15/7251 6:64 AM) Alcohol use Occasional alcohol use. Caffeine use Carbonated beverages, Coffee, Tea. No drug use Tobacco use Former smoker.  Family History Mammie Lorenzo, LPN; 03/15/4741 5:95 AM) Arthritis Mother. Respiratory Condition Brother, Father.  Other Problems Mammie Lorenzo, LPN; 6/38/7564 3:32 AM) Bladder Problems Colon Cancer Diabetes Mellitus Enlarged Prostate Hemorrhoids High blood pressure Hypercholesterolemia Kidney Stone Prostate Cancer     Review of Systems Claiborne Billings Dockery LPN; 9/51/8841 6:60 AM) General Not Present- Appetite Loss, Chills, Fatigue, Fever, Night Sweats, Weight Gain and Weight Loss. Skin Not Present- Change in Wart/Mole, Dryness, Hives, Jaundice, New Lesions, Non-Healing Wounds, Rash and Ulcer. HEENT Present- Hearing Loss and Wears glasses/contact lenses. Not Present- Earache, Hoarseness, Nose Bleed, Oral Ulcers, Ringing in the Ears, Seasonal Allergies, Sinus Pain, Sore Throat, Visual Disturbances and Yellow Eyes. Respiratory Present- Snoring. Not Present- Bloody sputum, Chronic Cough, Difficulty Breathing and Wheezing. Breast Not Present- Breast Mass, Breast Pain, Nipple Discharge and Skin Changes. Cardiovascular Not Present- Chest Pain, Difficulty Breathing Lying Down, Leg Cramps, Palpitations, Rapid Heart Rate, Shortness of Breath and Swelling of Extremities. Gastrointestinal Present- Bloody Stool and Hemorrhoids. Not Present- Abdominal Pain, Bloating, Change in Bowel Habits, Chronic diarrhea, Constipation, Difficulty Swallowing, Excessive gas, Gets full quickly at meals, Indigestion, Nausea, Rectal Pain and Vomiting. Male Genitourinary Present- Impotence. Not Present- Blood in Urine, Change in Urinary Stream, Frequency, Nocturia, Painful Urination, Urgency and Urine Leakage. Musculoskeletal Not  Present- Back Pain, Joint Pain, Joint Stiffness, Muscle Pain, Muscle Weakness and Swelling of Extremities. Neurological Not Present- Decreased Memory, Fainting, Headaches, Numbness, Seizures, Tingling, Tremor, Trouble walking and Weakness. Psychiatric Not Present- Anxiety, Bipolar, Change in Sleep Pattern, Depression, Fearful and Frequent crying. Endocrine Not Present- Cold Intolerance,  Excessive Hunger, Hair Changes, Heat Intolerance, Hot flashes and New Diabetes. Hematology Present- Easy Bruising. Not Present- Blood Thinners, Excessive bleeding, Gland problems, HIV and Persistent Infections.  Vitals (Sabrina Canty CMA; 01/01/2019 9:52 AM) 01/01/2019 9:51 AM Weight: 216.25 lb Height: 72in Body Surface Area: 2.2 m Body Mass Index: 29.33 kg/m  Temp.: 96.8F(Temporal)  Pulse: 97 (Regular)  BP: 190/92 (Sitting, Left Arm, Standard)      Physical Exam Isaac Ruff MD; 2/68/3419 10:11 AM)  General Mental Status-Alert. General Appearance-Not in acute distress. Build & Nutrition-Well nourished. Posture-Normal posture. Gait-Normal.  Head and Neck Head-normocephalic, atraumatic with no lesions or palpable masses. Trachea-midline.  Chest and Lung Exam Chest and lung exam reveals -on auscultation, normal breath sounds, no adventitious sounds and normal vocal resonance.  Cardiovascular Cardiovascular examination reveals -normal heart sounds, regular rate and rhythm with no murmurs and no digital clubbing, cyanosis, edema, increased warmth or tenderness.  Abdomen Inspection Inspection of the abdomen reveals - Note: Periumbilical surgical scar. Palpation/Percussion Palpation and Percussion of the abdomen reveal - Soft, Non Tender, No Rigidity (guarding), No hepatosplenomegaly and No Palpable abdominal masses.  Neurologic Neurologic evaluation reveals -alert and oriented x 3 with no impairment of recent or remote memory, normal attention span and ability  to concentrate, normal sensation and normal coordination.  Musculoskeletal Normal Exam - Bilateral-Upper Extremity Strength Normal and Lower Extremity Strength Normal.    Assessment & Plan Isaac Ruff MD; 06/03/2978 10:09 AM)  MASS OF COLON (G92.11) Impression: 78 year old male who presents to the office for evaluation of a newly diagnosed possible colon cancer. He was noted to have a mass in the sigmoid colon. Biopsies show high-grade dysplasia with possible invasion. His CT scan showed no signs of metastatic disease and CEA level was normal. He does have a surgical history of ventral hernia repair and prostate surgery performed laparoscopically and robotically respectively. We discussed the need for sigmoidectomy to determine his final pathology. The surgery and anatomy were described to the patient as well as the risks of surgery and the possible complications. These include: Bleeding, deep abdominal infections and possible wound complications such as hernia and infection, damage to adjacent structures, leak of surgical connections, which can lead to other surgeries and possibly an ostomy, possible need for other procedures, such as abscess drains in radiology, possible prolonged hospital stay, possible diarrhea from removal of part of the colon, possible constipation from narcotics, possible bowel, bladder or sexual dysfunction if having rectal surgery, prolonged fatigue/weakness or appetite loss, possible early recurrence of of disease, possible complications of their medical problems such as heart disease or arrhythmias or lung problems, death (less than 1%). I believe the patient understands and wishes to proceed with the surgery

## 2019-01-03 DIAGNOSIS — E119 Type 2 diabetes mellitus without complications: Secondary | ICD-10-CM | POA: Diagnosis not present

## 2019-01-03 DIAGNOSIS — I10 Essential (primary) hypertension: Secondary | ICD-10-CM | POA: Diagnosis not present

## 2019-01-03 DIAGNOSIS — Z794 Long term (current) use of insulin: Secondary | ICD-10-CM | POA: Diagnosis not present

## 2019-01-03 DIAGNOSIS — E785 Hyperlipidemia, unspecified: Secondary | ICD-10-CM | POA: Diagnosis not present

## 2019-01-26 DIAGNOSIS — Z8546 Personal history of malignant neoplasm of prostate: Secondary | ICD-10-CM | POA: Diagnosis not present

## 2019-02-01 NOTE — Patient Instructions (Addendum)
[name]  02/01/2019   Your procedure is scheduled on: 02-15-2019  Report to [facility]  Entrance  Report to admitting at  1100 AM    Call this number if you have problems the morning of surgery [phone]    Remember: Do not eat food or drink liquids :After Midnight. BRUSH YOUR TEETH MORNING OF SURGERY AND RINSE YOUR MOUTH OUT, NO CHEWING GUM CANDY OR MINTS.   DRINK 2 PRESURGERY ENSURE DRINKS THE NIGHT BEFORE SURGERY AT  1000 PM AND 1 PRESURGERY DRINK THE DAY OF THE PROCEDURE 3 HOURS PRIOR TO SCHEDULED SURGERY. NO SOLIDS AFTER MIDNIGHT THE DAY PRIOR TO THE SURGERY. NOTHING BY MOUTH EXCEPT CLEAR LIQUIDS UNTIL THREE HOURS PRIOR TO SCHEDULED SURGERY. PLEASE FINISH PRESURGERY ENSURE DRINK PER SURGEON ORDER 3 HOURS PRIOR TO SCHEDULED SURGERY TIME WHICH NEEDS TO BE COMPLETED AT 1000 AM.  BOWEL PREP WITH CLEAR LIQUIDS ALL DAY 02-14-2019 PER DR THOMAS INSTRUCTIONS.   CLEAR LIQUID DIET   Foods Allowed                                                                     Foods Excluded  Coffee and tea, regular and decaf                             liquids that you cannot  Plain Jell-O in any flavor                                             see through such as: Fruit ices (not with fruit pulp)                                     milk, soups, orange juice  Iced Popsicles                                    All solid food Carbonated beverages, regular and diet                                    Cranberry, grape and apple juices Sports drinks like Gatorade Lightly seasoned clear broth or consume(fat free) Sugar, honey syrup  Sample Menu Breakfast                                Lunch                                     Supper Cranberry juice                    Beef broth  Chicken broth Jell-O                                     Grape juice                           Apple juice Coffee or tea                        Jell-O                                       Popsicle                                                Coffee or tea                        Coffee or tea  _____________________________________________________________________    Take these medicines the morning of surgery with A SIP OF WATER: AMLODIPINE (NORVASC), NIFEDIPINE (PROCARDIA XL), CLONIDINE DO NOT TAKE ANY DIABETIC MEDICATIONS DAY OF YOUR SURGERY        How to Manage Your Diabetes Before and After Surgery  Why is it important to control my blood sugar before and after surgery? . Improving blood sugar levels before and after surgery helps healing and can limit problems. . A way of improving blood sugar control is eating a healthy diet by: o  Eating less sugar and carbohydrates o  Increasing activity/exercise o  Talking with your doctor about reaching your blood sugar goals . High blood sugars (greater than 180 mg/dL) can raise your risk of infections and slow your recovery, so you will need to focus on controlling your diabetes during the weeks before surgery. . Make sure that the doctor who takes care of your diabetes knows about your planned surgery including the date and location.  How do I manage my blood sugar before surgery? . Check your blood sugar at least 4 times a day, starting 2 days before surgery, to make sure that the level is not too high or low. o Check your blood sugar the morning of your surgery when you wake up and every 2 hours until you get to the Short Stay unit. . If your blood sugar is less than 70 mg/dL, you will need to treat for low blood sugar: o Do not take insulin. o Treat a low blood sugar (less than 70 mg/dL) with  cup of clear juice (cranberry or apple), 4 glucose tablets, OR glucose gel. o Recheck blood sugar in 15 minutes after treatment (to make sure it is greater than 70 mg/dL). If your blood sugar is not greater than 70 mg/dL on recheck, call [phone] for further instructions. . Report your blood sugar to the short  stay nurse when you get to Short Stay.  . If you are admitted to the hospital after surgery: o Your blood sugar will be checked by the staff and you will probably be given insulin after surgery (instead of oral diabetes medicines) to make sure you have good blood sugar levels. o The goal for blood sugar control after surgery is  80-180 mg/dL.   WHAT DO I DO ABOUT MY DIABETES MEDICATION?  Marland Kitchen Do not take oral diabetes medicines (pills) the morning of surgery.  . THE DAY  BEFORE SURGERY TAKE GLIMPERIDE AND TRESHEBA AS USUAL      . THE MORNING OF SURGERY DO NOT TAKE GLIMPERIDE AND TRESHEBA.  Marland Kitchen      Reviewed and Endorsed by [place] Patient Education Committee, August 2015                       You may not have any metal on your body including hair pins and              piercings  Do not wear jewelry, make-up, lotions, powders or perfumes, deodorant             Do not wear nail polish.  Do not shave  48 hours prior to surgery.              Men may shave face and neck.   Do not bring valuables to the hospital. [place].  Contacts, dentures or bridgework may not be worn into surgery.  Leave suitcase in the car. After surgery it may be brought to your room.     Patients discharged the day of surgery will not be allowed to drive home. IF YOU ARE HAVING SURGERY AND GOING HOME THE SAME DAY, YOU MUST HAVE AN ADULT TO DRIVE YOU HOME AND BE WITH YOU FOR 24 HOURS. YOU MAY GO HOME BY TAXI OR UBER OR ORTHERWISE, BUT AN ADULT MUST ACCOMPANY YOU HOME AND STAY WITH YOU FOR 24 HOURS.  Name and phone number of your driver:  Special Instructions: N/A              Please read over the following fact sheets you were given: _____________________________________________________________________ [facility] - Preparing for Surgery Before surgery, you can play an important role.  Because skin is not sterile, your skin needs to be as free of germs as  possible.  You can reduce the number of germs on your skin by washing with CHG (chlorahexidine gluconate) soap before surgery.  CHG is an antiseptic cleaner which kills germs and bonds with the skin to continue killing germs even after washing. Please DO NOT use if you have an allergy to CHG or antibacterial soaps.  If your skin becomes reddened/irritated stop using the CHG and inform your nurse when you arrive at Short Stay. Do not shave (including legs and underarms) for at least 48 hours prior to the first CHG shower.  You may shave your face/neck. Please follow these instructions carefully:  1.  Shower with CHG Soap the night before surgery and the  morning of Surgery.  2.  If you choose to wash your hair, wash your hair first as usual with your  normal  shampoo.  3.  After you shampoo, rinse your hair and body thoroughly to remove the  shampoo.                           4.  Use CHG as you would any other liquid soap.  You can apply chg directly  to the skin and wash  Gently with a scrungie or clean washcloth.  5.  Apply the CHG Soap to your body ONLY FROM THE NECK DOWN.   Do not use on face/ open                           Wound or open sores. Avoid contact with eyes, ears mouth and genitals (private parts).                       Wash face,  Genitals (private parts) with your normal soap.             6.  Wash thoroughly, paying special attention to the area where your surgery  will be performed.  7.  Thoroughly rinse your body with warm water from the neck down.  8.  DO NOT shower/wash with your normal soap after using and rinsing off  the CHG Soap.                9.  Pat yourself dry with a clean towel.            10.  Wear clean pajamas.            11.  Place clean sheets on your bed the night of your first shower and do not  sleep with pets. Day of Surgery : Do not apply any lotions/deodorants the morning of surgery.  Please wear clean clothes to the hospital/surgery  center.  FAILURE TO FOLLOW THESE INSTRUCTIONS MAY RESULT IN THE CANCELLATION OF YOUR SURGERY PATIENT SIGNATURE_________________________________  NURSE SIGNATURE__________________________________  ________________________________________________________________________  WHAT IS A BLOOD TRANSFUSION? Blood Transfusion Information  A transfusion is the replacement of blood or some of its parts. Blood is made up of multiple cells which provide different functions.  Red blood cells carry oxygen and are used for blood loss replacement.  White blood cells fight against infection.  Platelets control bleeding.  Plasma helps clot blood.  Other blood products are available for specialized needs, such as hemophilia or other clotting disorders. BEFORE THE TRANSFUSION  Who gives blood for transfusions?   Healthy volunteers who are fully evaluated to make sure their blood is safe. This is blood bank blood. Transfusion therapy is the safest it has ever been in the practice of medicine. Before blood is taken from a donor, a complete history is taken to make sure that person has no history of diseases nor engages in risky social behavior (examples are intravenous drug use or sexual activity with multiple partners). The donor's travel history is screened to minimize risk of transmitting infections, such as malaria. The donated blood is tested for signs of infectious diseases, such as HIV and hepatitis. The blood is then tested to be sure it is compatible with you in order to minimize the chance of a transfusion reaction. If you or a relative donates blood, this is often done in anticipation of surgery and is not appropriate for emergency situations. It takes many days to process the donated blood. RISKS AND COMPLICATIONS Although transfusion therapy is very safe and saves many lives, the main dangers of transfusion include:   Getting an infectious disease.  Developing a transfusion reaction. This is an  allergic reaction to something in the blood you were given. Every precaution is taken to prevent this. The decision to have a blood transfusion has been considered carefully by your caregiver before blood is given. Blood is not given unless the benefits  outweigh the risks. AFTER THE TRANSFUSION  Right after receiving a blood transfusion, you will usually feel much better and more energetic. This is especially true if your red blood cells have gotten low (anemic). The transfusion raises the level of the red blood cells which carry oxygen, and this usually causes an energy increase.  The nurse administering the transfusion will monitor you carefully for complications. HOME CARE INSTRUCTIONS  No special instructions are needed after a transfusion. You may find your energy is better. Speak with your caregiver about any limitations on activity for underlying diseases you may have. SEEK MEDICAL CARE IF:   Your condition is not improving after your transfusion.  You develop redness or irritation at the intravenous (IV) site. SEEK IMMEDIATE MEDICAL CARE IF:  Any of the following symptoms occur over the next 12 hours:  Shaking chills.  You have a temperature by mouth above 102 F (38.9 C), not controlled by medicine.  Chest, back, or muscle pain.  People around you feel you are not acting correctly or are confused.  Shortness of breath or difficulty breathing.  Dizziness and fainting.  You get a rash or develop hives.  You have a decrease in urine output.  Your urine turns a dark color or changes to pink, red, or brown. Any of the following symptoms occur over the next 10 days:  You have a temperature by mouth above 102 F (38.9 C), not controlled by medicine.  Shortness of breath.  Weakness after normal activity.  The white part of the eye turns yellow (jaundice).  You have a decrease in the amount of urine or are urinating less often.  Your urine turns a dark color or changes  to pink, red, or brown. Document Released: 11/26/2000 Document Revised: 02/21/2012 Document Reviewed: 07/15/2008 [facility] Patient Information 2014 [place], [place].  _______________________________________________________________________

## 2019-02-02 DIAGNOSIS — Z8551 Personal history of malignant neoplasm of bladder: Secondary | ICD-10-CM | POA: Diagnosis not present

## 2019-02-02 DIAGNOSIS — C61 Malignant neoplasm of prostate: Secondary | ICD-10-CM | POA: Diagnosis not present

## 2019-02-06 ENCOUNTER — Other Ambulatory Visit: Payer: Self-pay

## 2019-02-06 ENCOUNTER — Encounter (HOSPITAL_COMMUNITY)
Admission: RE | Admit: 2019-02-06 | Discharge: 2019-02-06 | Disposition: A | Discharge status: Home / Self Care | Payer: Medicare HMO | Source: Ambulatory Visit | Attending: General Surgery | Admitting: General Surgery

## 2019-02-06 ENCOUNTER — Encounter (HOSPITAL_COMMUNITY): Payer: Self-pay

## 2019-02-06 DIAGNOSIS — R9431 Abnormal electrocardiogram [ECG] [EKG]: Secondary | ICD-10-CM | POA: Insufficient documentation

## 2019-02-06 DIAGNOSIS — I10 Essential (primary) hypertension: Secondary | ICD-10-CM | POA: Insufficient documentation

## 2019-02-06 DIAGNOSIS — Z01818 Encounter for other preprocedural examination: Secondary | ICD-10-CM | POA: Diagnosis not present

## 2019-02-06 DIAGNOSIS — K6389 Other specified diseases of intestine: Secondary | ICD-10-CM | POA: Insufficient documentation

## 2019-02-06 LAB — HEMOGLOBIN A1C
Hgb A1c MFr Bld: 7.1 % — ABNORMAL HIGH (ref 4.8–5.6)
Mean Plasma Glucose: 157.07 mg/dL

## 2019-02-06 LAB — BASIC METABOLIC PANEL
ANION GAP: 7 (ref 5–15)
BUN: 38 mg/dL — ABNORMAL HIGH (ref 8–23)
CO2: 26 mmol/L (ref 22–32)
Calcium: 8.8 mg/dL — ABNORMAL LOW (ref 8.9–10.3)
Chloride: 108 mmol/L (ref 98–111)
Creatinine, Ser: 2.14 mg/dL — ABNORMAL HIGH (ref 0.61–1.24)
GFR calc Af Amer: 34 mL/min — ABNORMAL LOW (ref >60)
GFR calc non Af Amer: 29 mL/min — ABNORMAL LOW (ref >60)
Glucose, Bld: 108 mg/dL — ABNORMAL HIGH (ref 70–99)
Potassium: 4.8 mmol/L (ref 3.5–5.1)
Sodium: 141 mmol/L (ref 135–145)

## 2019-02-06 LAB — CBC
HCT: 40.8 % (ref 39.0–52.0)
Hemoglobin: 12.9 g/dL — ABNORMAL LOW (ref 13.0–17.0)
MCH: 28.9 pg (ref 26.0–34.0)
MCHC: 31.6 g/dL (ref 30.0–36.0)
MCV: 91.3 fL (ref 80.0–100.0)
Platelets: 219 10*3/uL (ref 150–400)
RBC: 4.47 MIL/uL (ref 4.22–5.81)
RDW: 13.5 % (ref 11.5–15.5)
WBC: 5.8 10*3/uL (ref 4.0–10.5)
nRBC: 0 % (ref 0.0–0.2)

## 2019-02-06 LAB — GLUCOSE, CAPILLARY: Glucose-Capillary: 126 mg/dL — ABNORMAL HIGH (ref 70–99)

## 2019-02-06 NOTE — Progress Notes (Signed)
Chest ct 01-01-18 epic

## 2019-02-12 NOTE — Progress Notes (Signed)
Spoke with patient wife by phone aware surgery time changed arrive 530 am 02-15-2019 [facility] short stay drink presurgery  Drink at 430 am 02-15-2019, then npo

## 2019-02-14 ENCOUNTER — Encounter (HOSPITAL_COMMUNITY): Payer: Self-pay | Admitting: Anesthesiology

## 2019-02-14 MED ORDER — BUPIVACAINE LIPOSOME 1.3 % IJ SUSP
20.0000 mL | Freq: Once | INTRAMUSCULAR | Status: DC
  Filled 2019-02-14: qty 20

## 2019-02-14 NOTE — Anesthesia Preprocedure Evaluation (Addendum)
Anesthesia Evaluation  Patient identified by MRN, date of birth, ID band Patient awake    Reviewed: Allergy & Precautions, NPO status , Patient's Chart, lab work & pertinent test results  Airway Mallampati: III  TM Distance: >3 FB Neck ROM: Full    Dental  (+) Caps, Partial Upper, Partial Lower   Pulmonary former smoker,    Pulmonary exam normal breath sounds clear to auscultation       Cardiovascular hypertension, Pt. on medications Normal cardiovascular exam Rhythm:Regular Rate:Normal     Neuro/Psych negative neurological ROS  negative psych ROS   GI/Hepatic negative GI ROS, Neg liver ROS, Colon mass Nauseated this am   Endo/Other  diabetes, Well Controlled, Type 2, Insulin Dependent, Oral Hypoglycemic AgentsHyperlipidemia  Renal/GU negative Renal ROS   Hx/o prostate and bladder Ca negative genitourinary   Musculoskeletal negative musculoskeletal ROS (+)   Abdominal (+)  Abdomen: soft.    Peds  Hematology negative hematology ROS (+)   Anesthesia Other Findings   Reproductive/Obstetrics                           Anesthesia Physical Anesthesia Plan  ASA: III  Anesthesia Plan: General   Post-op Pain Management:    Induction: Intravenous, Rapid sequence and Cricoid pressure planned  PONV Risk Score and Plan: 4 or greater and Ondansetron, Dexamethasone, Treatment may vary due to age or medical condition and Scopolamine patch - Pre-op  Airway Management Planned: Oral ETT  Additional Equipment:   Intra-op Plan:   Post-operative Plan: Extubation in OR  Informed Consent: I have reviewed the patients History and Physical, chart, labs and discussed the procedure including the risks, benefits and alternatives for the proposed anesthesia with the patient or authorized representative who has indicated his/her understanding and acceptance.     Dental advisory given  Plan Discussed  with: CRNA and Surgeon  Anesthesia Plan Comments:        Anesthesia Quick Evaluation

## 2019-02-15 ENCOUNTER — Inpatient Hospital Stay (HOSPITAL_COMMUNITY): Payer: Medicare HMO | Admitting: Certified Registered Nurse Anesthetist

## 2019-02-15 ENCOUNTER — Encounter (HOSPITAL_COMMUNITY)
Admission: RE | Disposition: A | Discharge status: Home / Self Care | Payer: Self-pay | Source: Home / Self Care | Attending: General Surgery

## 2019-02-15 ENCOUNTER — Inpatient Hospital Stay (HOSPITAL_COMMUNITY)
Admission: RE | Admit: 2019-02-15 | Discharge: 2019-02-17 | DRG: 330 | Disposition: A | Discharge status: Home / Self Care | Payer: Medicare HMO | Attending: General Surgery | Admitting: General Surgery

## 2019-02-15 ENCOUNTER — Inpatient Hospital Stay (HOSPITAL_COMMUNITY): Payer: Medicare HMO | Admitting: Physician Assistant

## 2019-02-15 ENCOUNTER — Encounter (HOSPITAL_COMMUNITY): Payer: Self-pay | Admitting: *Deleted

## 2019-02-15 ENCOUNTER — Other Ambulatory Visit: Payer: Self-pay

## 2019-02-15 DIAGNOSIS — E1122 Type 2 diabetes mellitus with diabetic chronic kidney disease: Secondary | ICD-10-CM | POA: Diagnosis present

## 2019-02-15 DIAGNOSIS — K4030 Unilateral inguinal hernia, with obstruction, without gangrene, not specified as recurrent: Secondary | ICD-10-CM | POA: Diagnosis not present

## 2019-02-15 DIAGNOSIS — Z7984 Long term (current) use of oral hypoglycemic drugs: Secondary | ICD-10-CM | POA: Diagnosis not present

## 2019-02-15 DIAGNOSIS — I10 Essential (primary) hypertension: Secondary | ICD-10-CM | POA: Diagnosis not present

## 2019-02-15 DIAGNOSIS — Z8546 Personal history of malignant neoplasm of prostate: Secondary | ICD-10-CM | POA: Diagnosis not present

## 2019-02-15 DIAGNOSIS — Z87891 Personal history of nicotine dependence: Secondary | ICD-10-CM

## 2019-02-15 DIAGNOSIS — N189 Chronic kidney disease, unspecified: Secondary | ICD-10-CM | POA: Diagnosis not present

## 2019-02-15 DIAGNOSIS — D125 Benign neoplasm of sigmoid colon: Secondary | ICD-10-CM | POA: Diagnosis not present

## 2019-02-15 DIAGNOSIS — K6389 Other specified diseases of intestine: Secondary | ICD-10-CM | POA: Diagnosis present

## 2019-02-15 DIAGNOSIS — I129 Hypertensive chronic kidney disease with stage 1 through stage 4 chronic kidney disease, or unspecified chronic kidney disease: Secondary | ICD-10-CM | POA: Diagnosis present

## 2019-02-15 DIAGNOSIS — E119 Type 2 diabetes mellitus without complications: Secondary | ICD-10-CM | POA: Diagnosis not present

## 2019-02-15 DIAGNOSIS — K639 Disease of intestine, unspecified: Secondary | ICD-10-CM | POA: Diagnosis not present

## 2019-02-15 DIAGNOSIS — K635 Polyp of colon: Secondary | ICD-10-CM | POA: Diagnosis present

## 2019-02-15 HISTORY — PX: INGUINAL HERNIA REPAIR: SHX194

## 2019-02-15 LAB — TYPE AND SCREEN
ABO/RH(D): O NEG
Antibody Screen: NEGATIVE

## 2019-02-15 LAB — GLUCOSE, CAPILLARY
Glucose-Capillary: 107 mg/dL — ABNORMAL HIGH (ref 70–99)
Glucose-Capillary: 192 mg/dL — ABNORMAL HIGH (ref 70–99)
Glucose-Capillary: 175 mg/dL — ABNORMAL HIGH (ref 70–99)
Glucose-Capillary: 223 mg/dL — ABNORMAL HIGH (ref 70–99)
Glucose-Capillary: 200 mg/dL — ABNORMAL HIGH (ref 70–99)

## 2019-02-15 SURGERY — COLECTOMY, PARTIAL, ROBOT-ASSISTED, LAPAROSCOPIC
Anesthesia: General | Site: Inguinal | Laterality: Right

## 2019-02-15 MED ORDER — PROMETHAZINE HCL 25 MG/ML IJ SOLN
INTRAMUSCULAR | Status: AC
  Filled 2019-02-15: qty 1

## 2019-02-15 MED ORDER — LIDOCAINE HCL 2 % IJ SOLN
INTRAMUSCULAR | Status: AC
  Filled 2019-02-15: qty 20

## 2019-02-15 MED ORDER — GABAPENTIN 300 MG PO CAPS
300.0000 mg | ORAL_CAPSULE | ORAL | Status: AC
  Administered 2019-02-15: 300 mg via ORAL
  Filled 2019-02-15: qty 1

## 2019-02-15 MED ORDER — PROPOFOL 10 MG/ML IV BOLUS
INTRAVENOUS | Status: AC
  Filled 2019-02-15: qty 20

## 2019-02-15 MED ORDER — LIDOCAINE 2% (20 MG/ML) 5 ML SYRINGE
INTRAMUSCULAR | Status: AC
  Filled 2019-02-15: qty 5

## 2019-02-15 MED ORDER — BUPIVACAINE-EPINEPHRINE (PF) 0.25% -1:200000 IJ SOLN
INTRAMUSCULAR | Status: AC
  Filled 2019-02-15: qty 30

## 2019-02-15 MED ORDER — ONDANSETRON HCL 4 MG/2ML IJ SOLN
4.0000 mg | Freq: Once | INTRAMUSCULAR | Status: AC
  Administered 2019-02-15: 4 mg via INTRAVENOUS

## 2019-02-15 MED ORDER — SUCCINYLCHOLINE CHLORIDE 200 MG/10ML IV SOSY
PREFILLED_SYRINGE | INTRAVENOUS | Status: AC
  Filled 2019-02-15: qty 10

## 2019-02-15 MED ORDER — LABETALOL HCL 5 MG/ML IV SOLN
INTRAVENOUS | Status: DC | PRN
  Administered 2019-02-15 (×2): 5 mg via INTRAVENOUS

## 2019-02-15 MED ORDER — ACETAMINOPHEN 500 MG PO TABS
1000.0000 mg | ORAL_TABLET | ORAL | Status: AC
  Administered 2019-02-15: 1000 mg via ORAL
  Filled 2019-02-15: qty 2

## 2019-02-15 MED ORDER — ONDANSETRON HCL 4 MG/2ML IJ SOLN
INTRAMUSCULAR | Status: AC
  Filled 2019-02-15: qty 2

## 2019-02-15 MED ORDER — HYDROMORPHONE HCL 1 MG/ML IJ SOLN: 0.2500 mg | INTRAMUSCULAR | Status: DC | PRN

## 2019-02-15 MED ORDER — ALVIMOPAN 12 MG PO CAPS
12.0000 mg | ORAL_CAPSULE | ORAL | Status: AC
  Administered 2019-02-15: 12 mg via ORAL
  Filled 2019-02-15: qty 1

## 2019-02-15 MED ORDER — DIPHENHYDRAMINE HCL 25 MG PO CAPS
50.0000 mg | ORAL_CAPSULE | Freq: Every morning | ORAL | Status: DC
  Filled 2019-02-15: qty 2

## 2019-02-15 MED ORDER — EPHEDRINE SULFATE-NACL 50-0.9 MG/10ML-% IV SOSY
PREFILLED_SYRINGE | INTRAVENOUS | Status: DC | PRN
  Administered 2019-02-15: 5 mg via INTRAVENOUS

## 2019-02-15 MED ORDER — MEPERIDINE HCL 50 MG/ML IJ SOLN: 6.2500 mg | INTRAMUSCULAR | Status: DC | PRN

## 2019-02-15 MED ORDER — BUPIVACAINE-EPINEPHRINE (PF) 0.25% -1:200000 IJ SOLN
INTRAMUSCULAR | Status: DC | PRN
  Administered 2019-02-15: 30 mL

## 2019-02-15 MED ORDER — PROMETHAZINE HCL 25 MG/ML IJ SOLN
INTRAMUSCULAR | Status: DC | PRN
  Administered 2019-02-15: 6.25 mg via INTRAVENOUS

## 2019-02-15 MED ORDER — GABAPENTIN 300 MG PO CAPS
300.0000 mg | ORAL_CAPSULE | Freq: Two times a day (BID) | ORAL | Status: DC
  Administered 2019-02-15 – 2019-02-16 (×3): 300 mg via ORAL
  Filled 2019-02-15 (×3): qty 1

## 2019-02-15 MED ORDER — LIDOCAINE 2% (20 MG/ML) 5 ML SYRINGE
INTRAMUSCULAR | Status: DC | PRN
  Administered 2019-02-15: 60 mg via INTRAVENOUS

## 2019-02-15 MED ORDER — LACTATED RINGERS IR SOLN
Status: DC | PRN
  Administered 2019-02-15: 1000 mL

## 2019-02-15 MED ORDER — MIDAZOLAM HCL 5 MG/5ML IJ SOLN
INTRAMUSCULAR | Status: DC | PRN
  Administered 2019-02-15 (×2): 1 mg via INTRAVENOUS

## 2019-02-15 MED ORDER — NIFEDIPINE ER OSMOTIC RELEASE 60 MG PO TB24
90.0000 mg | ORAL_TABLET | Freq: Every morning | ORAL | Status: DC
  Administered 2019-02-16: 90 mg via ORAL
  Filled 2019-02-15 (×2): qty 1

## 2019-02-15 MED ORDER — LIDOCAINE 20MG/ML (2%) 15 ML SYRINGE OPTIME
INTRAMUSCULAR | Status: DC | PRN
  Administered 2019-02-15: 1.5 mg/kg/h via INTRAVENOUS

## 2019-02-15 MED ORDER — SODIUM CHLORIDE 0.9 % IV SOLN
2.0000 g | Freq: Two times a day (BID) | INTRAVENOUS | Status: AC
  Administered 2019-02-15: 2 g via INTRAVENOUS
  Filled 2019-02-15: qty 2

## 2019-02-15 MED ORDER — SODIUM CHLORIDE 0.9 % IV SOLN
2.0000 g | INTRAVENOUS | Status: AC
  Administered 2019-02-15: 2 g via INTRAVENOUS
  Filled 2019-02-15: qty 2

## 2019-02-15 MED ORDER — ONDANSETRON HCL 4 MG/2ML IJ SOLN: 4.0000 mg | Freq: Four times a day (QID) | INTRAMUSCULAR | Status: DC | PRN

## 2019-02-15 MED ORDER — SUGAMMADEX SODIUM 200 MG/2ML IV SOLN
INTRAVENOUS | Status: DC | PRN
  Administered 2019-02-15: 200 mg via INTRAVENOUS

## 2019-02-15 MED ORDER — SIMVASTATIN 40 MG PO TABS
40.0000 mg | ORAL_TABLET | Freq: Every day | ORAL | Status: DC
  Administered 2019-02-15 – 2019-02-16 (×2): 40 mg via ORAL
  Filled 2019-02-15 (×2): qty 1

## 2019-02-15 MED ORDER — DEXAMETHASONE SODIUM PHOSPHATE 10 MG/ML IJ SOLN
INTRAMUSCULAR | Status: AC
  Filled 2019-02-15: qty 1

## 2019-02-15 MED ORDER — ENSURE SURGERY PO LIQD
237.0000 mL | Freq: Two times a day (BID) | ORAL | Status: DC
  Administered 2019-02-15: 237 mL via ORAL
  Filled 2019-02-15 (×5): qty 237

## 2019-02-15 MED ORDER — CLONIDINE HCL 0.2 MG PO TABS
0.2000 mg | ORAL_TABLET | Freq: Two times a day (BID) | ORAL | Status: DC
  Administered 2019-02-15 – 2019-02-16 (×3): 0.2 mg via ORAL
  Filled 2019-02-15 (×3): qty 1

## 2019-02-15 MED ORDER — SCOPOLAMINE 1 MG/3DAYS TD PT72
1.0000 | MEDICATED_PATCH | TRANSDERMAL | Status: DC
  Administered 2019-02-15: 1.5 mg via TRANSDERMAL

## 2019-02-15 MED ORDER — PROPOFOL 10 MG/ML IV BOLUS
INTRAVENOUS | Status: DC | PRN
  Administered 2019-02-15: 150 mg via INTRAVENOUS
  Administered 2019-02-15: 50 mg via INTRAVENOUS

## 2019-02-15 MED ORDER — SCOPOLAMINE 1 MG/3DAYS TD PT72
MEDICATED_PATCH | TRANSDERMAL | Status: AC
  Administered 2019-02-15: 1.5 mg via TRANSDERMAL
  Filled 2019-02-15: qty 1

## 2019-02-15 MED ORDER — FENTANYL CITRATE (PF) 250 MCG/5ML IJ SOLN
INTRAMUSCULAR | Status: AC
  Filled 2019-02-15: qty 5

## 2019-02-15 MED ORDER — SUCCINYLCHOLINE CHLORIDE 200 MG/10ML IV SOSY
PREFILLED_SYRINGE | INTRAVENOUS | Status: DC | PRN
  Administered 2019-02-15: 120 mg via INTRAVENOUS

## 2019-02-15 MED ORDER — LACTATED RINGERS IV SOLN
INTRAVENOUS | Status: DC
  Administered 2019-02-15 (×2): via INTRAVENOUS

## 2019-02-15 MED ORDER — BUPIVACAINE LIPOSOME 1.3 % IJ SUSP
INTRAMUSCULAR | Status: DC | PRN
  Administered 2019-02-15: 20 mL

## 2019-02-15 MED ORDER — KCL IN DEXTROSE-NACL 20-5-0.45 MEQ/L-%-% IV SOLN
INTRAVENOUS | Status: DC
  Administered 2019-02-15 – 2019-02-16 (×2): via INTRAVENOUS
  Filled 2019-02-15 (×2): qty 1000

## 2019-02-15 MED ORDER — KETAMINE HCL 10 MG/ML IJ SOLN
INTRAMUSCULAR | Status: AC
  Filled 2019-02-15: qty 1

## 2019-02-15 MED ORDER — ONDANSETRON HCL 4 MG PO TABS: 4.0000 mg | ORAL_TABLET | Freq: Four times a day (QID) | ORAL | Status: DC | PRN

## 2019-02-15 MED ORDER — HEPARIN SODIUM (PORCINE) 5000 UNIT/ML IJ SOLN
5000.0000 [IU] | Freq: Once | INTRAMUSCULAR | Status: AC
  Administered 2019-02-15: 5000 [IU] via SUBCUTANEOUS
  Filled 2019-02-15: qty 1

## 2019-02-15 MED ORDER — ALVIMOPAN 12 MG PO CAPS
12.0000 mg | ORAL_CAPSULE | Freq: Two times a day (BID) | ORAL | Status: DC
  Administered 2019-02-16: 12 mg via ORAL
  Filled 2019-02-15: qty 1

## 2019-02-15 MED ORDER — ALUM & MAG HYDROXIDE-SIMETH 200-200-20 MG/5ML PO SUSP: 30.0000 mL | Freq: Four times a day (QID) | ORAL | Status: DC | PRN

## 2019-02-15 MED ORDER — DEXAMETHASONE SODIUM PHOSPHATE 4 MG/ML IJ SOLN
INTRAMUSCULAR | Status: DC | PRN
  Administered 2019-02-15: 10 mg via INTRAVENOUS

## 2019-02-15 MED ORDER — INSULIN ASPART 100 UNIT/ML ~~LOC~~ SOLN
0.0000 [IU] | Freq: Three times a day (TID) | SUBCUTANEOUS | Status: DC
  Administered 2019-02-15: 7 [IU] via SUBCUTANEOUS
  Administered 2019-02-16 (×2): 3 [IU] via SUBCUTANEOUS

## 2019-02-15 MED ORDER — INSULIN ASPART 100 UNIT/ML ~~LOC~~ SOLN: 0.0000 [IU] | Freq: Every day | SUBCUTANEOUS | Status: DC

## 2019-02-15 MED ORDER — MIDAZOLAM HCL 2 MG/2ML IJ SOLN
INTRAMUSCULAR | Status: AC
  Filled 2019-02-15: qty 2

## 2019-02-15 MED ORDER — HYDROMORPHONE HCL 1 MG/ML IJ SOLN: 0.5000 mg | INTRAMUSCULAR | Status: DC | PRN

## 2019-02-15 MED ORDER — ONDANSETRON HCL 4 MG/2ML IJ SOLN
INTRAMUSCULAR | Status: DC | PRN
  Administered 2019-02-15: 4 mg via INTRAVENOUS

## 2019-02-15 MED ORDER — TRAMADOL HCL 50 MG PO TABS: 50.0000 mg | ORAL_TABLET | Freq: Four times a day (QID) | ORAL | Status: DC | PRN

## 2019-02-15 MED ORDER — GLIMEPIRIDE 4 MG PO TABS
4.0000 mg | ORAL_TABLET | Freq: Every day | ORAL | Status: DC
  Administered 2019-02-16 – 2019-02-17 (×2): 4 mg via ORAL
  Filled 2019-02-15 (×2): qty 1

## 2019-02-15 MED ORDER — ENOXAPARIN SODIUM 40 MG/0.4ML ~~LOC~~ SOLN
40.0000 mg | SUBCUTANEOUS | Status: DC
  Administered 2019-02-16 – 2019-02-17 (×2): 40 mg via SUBCUTANEOUS
  Filled 2019-02-15 (×2): qty 0.4

## 2019-02-15 MED ORDER — INDOCYANINE GREEN 25 MG IV SOLR
INTRAVENOUS | Status: DC | PRN
  Administered 2019-02-15: 7.5 mg via INTRAVENOUS

## 2019-02-15 MED ORDER — SUGAMMADEX SODIUM 200 MG/2ML IV SOLN
INTRAVENOUS | Status: AC
  Filled 2019-02-15: qty 2

## 2019-02-15 MED ORDER — FENTANYL CITRATE (PF) 100 MCG/2ML IJ SOLN
INTRAMUSCULAR | Status: DC | PRN
  Administered 2019-02-15 (×2): 100 ug via INTRAVENOUS
  Administered 2019-02-15 (×3): 50 ug via INTRAVENOUS

## 2019-02-15 MED ORDER — ROCURONIUM BROMIDE 10 MG/ML (PF) SYRINGE
PREFILLED_SYRINGE | INTRAVENOUS | Status: DC | PRN
  Administered 2019-02-15: 10 mg via INTRAVENOUS
  Administered 2019-02-15: 60 mg via INTRAVENOUS

## 2019-02-15 MED ORDER — FENOFIBRATE 160 MG PO TABS
160.0000 mg | ORAL_TABLET | Freq: Every morning | ORAL | Status: DC
  Administered 2019-02-15: 160 mg via ORAL
  Filled 2019-02-15 (×2): qty 1

## 2019-02-15 MED ORDER — FENTANYL CITRATE (PF) 100 MCG/2ML IJ SOLN
INTRAMUSCULAR | Status: AC
  Filled 2019-02-15: qty 2

## 2019-02-15 MED ORDER — ACETAMINOPHEN 500 MG PO TABS
1000.0000 mg | ORAL_TABLET | Freq: Four times a day (QID) | ORAL | Status: DC
  Administered 2019-02-15 – 2019-02-17 (×7): 1000 mg via ORAL
  Filled 2019-02-15 (×8): qty 2

## 2019-02-15 MED ORDER — 0.9 % SODIUM CHLORIDE (POUR BTL) OPTIME
TOPICAL | Status: DC | PRN
  Administered 2019-02-15: 2000 mL

## 2019-02-15 MED ORDER — SACCHAROMYCES BOULARDII 250 MG PO CAPS
250.0000 mg | ORAL_CAPSULE | Freq: Two times a day (BID) | ORAL | Status: DC
  Administered 2019-02-15 – 2019-02-16 (×3): 250 mg via ORAL
  Filled 2019-02-15 (×3): qty 1

## 2019-02-15 MED ORDER — KETAMINE HCL 10 MG/ML IJ SOLN
INTRAMUSCULAR | Status: DC | PRN
  Administered 2019-02-15: 25 mg via INTRAVENOUS

## 2019-02-15 MED ORDER — LABETALOL HCL 5 MG/ML IV SOLN
INTRAVENOUS | Status: AC
  Filled 2019-02-15: qty 4

## 2019-02-15 MED ORDER — EPHEDRINE 5 MG/ML INJ
INTRAVENOUS | Status: AC
  Filled 2019-02-15: qty 10

## 2019-02-15 MED ORDER — ROCURONIUM BROMIDE 100 MG/10ML IV SOLN
INTRAVENOUS | Status: AC
  Filled 2019-02-15: qty 1

## 2019-02-15 MED ORDER — METOCLOPRAMIDE HCL 5 MG/ML IJ SOLN: 10.0000 mg | Freq: Once | INTRAMUSCULAR | Status: DC | PRN

## 2019-02-15 SURGICAL SUPPLY — 96 items
BLADE EXTENDED COATED 6.5IN (ELECTRODE) IMPLANT
CANNULA REDUC XI 12-8 STAPL (CANNULA) ×1
CANNULA REDUC XI 12-8MM STAPL (CANNULA) ×1
CANNULA REDUCER 12-8 DVNC XI (CANNULA) ×2 IMPLANT
CELLS DAT CNTRL 66122 CELL SVR (MISCELLANEOUS) IMPLANT
CLIP VESOLOCK LG 6/CT PURPLE (CLIP) IMPLANT
CLIP VESOLOCK MED 6/CT (CLIP) IMPLANT
COVER SURGICAL LIGHT HANDLE (MISCELLANEOUS) ×8 IMPLANT
COVER TIP SHEARS 8 DVNC (MISCELLANEOUS) ×2 IMPLANT
COVER TIP SHEARS 8MM DA VINCI (MISCELLANEOUS) ×2
COVER WAND RF STERILE (DRAPES) ×4 IMPLANT
DECANTER SPIKE VIAL GLASS SM (MISCELLANEOUS) IMPLANT
DERMABOND ADVANCED (GAUZE/BANDAGES/DRESSINGS) ×2
DERMABOND ADVANCED .7 DNX12 (GAUZE/BANDAGES/DRESSINGS) ×2 IMPLANT
DEVICE TROCAR PUNCTURE CLOSURE (ENDOMECHANICALS) ×4 IMPLANT
DRAIN CHANNEL 19F RND (DRAIN) IMPLANT
DRAPE ARM DVNC X/XI (DISPOSABLE) ×8 IMPLANT
DRAPE COLUMN DVNC XI (DISPOSABLE) ×2 IMPLANT
DRAPE DA VINCI XI ARM (DISPOSABLE) ×8
DRAPE DA VINCI XI COLUMN (DISPOSABLE) ×2
DRAPE SURG IRRIG POUCH 19X23 (DRAPES) ×4 IMPLANT
DRSG OPSITE POSTOP 4X10 (GAUZE/BANDAGES/DRESSINGS) IMPLANT
DRSG OPSITE POSTOP 4X6 (GAUZE/BANDAGES/DRESSINGS) ×4 IMPLANT
DRSG OPSITE POSTOP 4X8 (GAUZE/BANDAGES/DRESSINGS) IMPLANT
ELECT PENCIL ROCKER SW 15FT (MISCELLANEOUS) ×4 IMPLANT
ELECT REM PT RETURN 15FT ADLT (MISCELLANEOUS) ×4 IMPLANT
ENDOLOOP SUT PDS II  0 18 (SUTURE)
ENDOLOOP SUT PDS II 0 18 (SUTURE) IMPLANT
EVACUATOR SILICONE 100CC (DRAIN) IMPLANT
GAUZE SPONGE 4X4 12PLY STRL (GAUZE/BANDAGES/DRESSINGS) IMPLANT
GLOVE BIO SURGEON STRL SZ 6.5 (GLOVE) ×9 IMPLANT
GLOVE BIO SURGEONS STRL SZ 6.5 (GLOVE) ×3
GLOVE BIOGEL PI IND STRL 7.0 (GLOVE) ×6 IMPLANT
GLOVE BIOGEL PI INDICATOR 7.0 (GLOVE) ×6
GOWN STRL REUS W/TWL 2XL LVL3 (GOWN DISPOSABLE) ×12 IMPLANT
GOWN STRL REUS W/TWL XL LVL3 (GOWN DISPOSABLE) ×16 IMPLANT
GRASPER ENDOPATH ANVIL 10MM (MISCELLANEOUS) IMPLANT
GRASPER SUT TROCAR 14GX15 (MISCELLANEOUS) IMPLANT
HOLDER FOLEY CATH W/STRAP (MISCELLANEOUS) ×4 IMPLANT
IRRIG SUCT STRYKERFLOW 2 WTIP (MISCELLANEOUS) ×4
IRRIGATION SUCT STRKRFLW 2 WTP (MISCELLANEOUS) ×2 IMPLANT
IRRIGATOR SUCT 8 DISP DVNC XI (IRRIGATION / IRRIGATOR) IMPLANT
IRRIGATOR SUCTION 8MM XI DISP (IRRIGATION / IRRIGATOR)
KIT PROCEDURE DA VINCI SI (MISCELLANEOUS) ×2
KIT PROCEDURE DVNC SI (MISCELLANEOUS) ×2 IMPLANT
KIT SIGMOIDOSCOPE (SET/KITS/TRAYS/PACK) ×4 IMPLANT
NEEDLE INSUFFLATION 14GA 120MM (NEEDLE) ×4 IMPLANT
PACK CARDIOVASCULAR III (CUSTOM PROCEDURE TRAY) ×4 IMPLANT
PACK COLON (CUSTOM PROCEDURE TRAY) ×4 IMPLANT
PORT LAP GEL ALEXIS MED 5-9CM (MISCELLANEOUS) ×4 IMPLANT
RTRCTR WOUND ALEXIS 18CM MED (MISCELLANEOUS)
SCISSORS LAP 5X35 DISP (ENDOMECHANICALS) ×4 IMPLANT
SEAL CANN UNIV 5-8 DVNC XI (MISCELLANEOUS) ×6 IMPLANT
SEAL XI 5MM-8MM UNIVERSAL (MISCELLANEOUS) ×6
SEALER VESSEL DA VINCI XI (MISCELLANEOUS) ×2
SEALER VESSEL EXT DVNC XI (MISCELLANEOUS) ×2 IMPLANT
SLEEVE ADV FIXATION 5X100MM (TROCAR) IMPLANT
SOLUTION ELECTROLUBE (MISCELLANEOUS) ×4 IMPLANT
STAPLER 45 BLU RELOAD XI (STAPLE) ×4 IMPLANT
STAPLER 45 BLUE RELOAD XI (STAPLE) ×4
STAPLER 45 GREEN RELOAD XI (STAPLE)
STAPLER 45 GRN RELOAD XI (STAPLE) IMPLANT
STAPLER CANNULA SEAL DVNC XI (STAPLE) ×2 IMPLANT
STAPLER CANNULA SEAL XI (STAPLE) ×2
STAPLER ECHELON POWER CIR 29 (STAPLE) ×4 IMPLANT
STAPLER SHEATH (SHEATH) ×2
STAPLER SHEATH ENDOWRIST DVNC (SHEATH) ×2 IMPLANT
STAPLER VISISTAT 35W (STAPLE) IMPLANT
SUT ETHILON 2 0 PS N (SUTURE) IMPLANT
SUT NOVA NAB DX-16 0-1 5-0 T12 (SUTURE) ×8 IMPLANT
SUT NOVA NAB GS-21 1 T12 (SUTURE) ×8 IMPLANT
SUT PROLENE 2 0 KS (SUTURE) ×4 IMPLANT
SUT SILK 2 0 (SUTURE) ×2
SUT SILK 2 0 SH CR/8 (SUTURE) ×4 IMPLANT
SUT SILK 2-0 18XBRD TIE 12 (SUTURE) ×2 IMPLANT
SUT SILK 3 0 (SUTURE)
SUT SILK 3 0 SH CR/8 (SUTURE) ×4 IMPLANT
SUT SILK 3-0 18XBRD TIE 12 (SUTURE) IMPLANT
SUT V-LOC BARB 180 2/0GR6 GS22 (SUTURE)
SUT VIC AB 2-0 SH 18 (SUTURE) IMPLANT
SUT VIC AB 2-0 SH 27 (SUTURE) ×2
SUT VIC AB 2-0 SH 27X BRD (SUTURE) ×2 IMPLANT
SUT VIC AB 3-0 SH 18 (SUTURE) IMPLANT
SUT VIC AB 4-0 PS2 27 (SUTURE) ×8 IMPLANT
SUT VICRYL 0 UR6 27IN ABS (SUTURE) ×4 IMPLANT
SUTURE V-LC BRB 180 2/0GR6GS22 (SUTURE) IMPLANT
SYR 10ML ECCENTRIC (SYRINGE) ×4 IMPLANT
SYS LAPSCP GELPORT 120MM (MISCELLANEOUS)
SYSTEM LAPSCP GELPORT 120MM (MISCELLANEOUS) IMPLANT
TOWEL OR 17X26 10 PK STRL BLUE (TOWEL DISPOSABLE) IMPLANT
TOWEL OR NON WOVEN STRL DISP B (DISPOSABLE) ×4 IMPLANT
TRAY FOLEY MTR SLVR 16FR STAT (SET/KITS/TRAYS/PACK) ×4 IMPLANT
TROCAR ADV FIXATION 5X100MM (TROCAR) ×4 IMPLANT
TUBING CONNECTING 10 (TUBING) ×6 IMPLANT
TUBING CONNECTING 10' (TUBING) ×2
TUBING INSUFFLATION 10FT LAP (TUBING) ×4 IMPLANT

## 2019-02-15 NOTE — Transfer of Care (Signed)
Immediate Anesthesia Transfer of Care Note  Patient: Isaac Stewart  Procedure(s) Performed: XI ROBOTIC ASSISTED SIGMOIDECTOMY (N/A Abdomen) PRIMARY RIGHT INCARCERATED INGUINAL HERNIA REPAIR (Right Inguinal)  Patient Location: PACU  Anesthesia Type:MAC  Level of Consciousness: awake and patient cooperative  Airway & Oxygen Therapy: Patient Spontanous Breathing and Patient connected to face mask  Post-op Assessment: Report given to RN and Post -op Vital signs reviewed and stable  Post vital signs: Reviewed and stable  Last Vitals:  Vitals Value Taken Time  BP 190/80 02/15/2019 10:39 AM  Temp    Pulse 73 02/15/2019 10:40 AM  Resp 10 02/15/2019 10:40 AM  SpO2 97 % 02/15/2019 10:40 AM  Vitals shown include unvalidated device data.  Last Pain:  Vitals:   02/15/19 0546  TempSrc: Oral         Complications: No apparent anesthesia complications

## 2019-02-15 NOTE — Anesthesia Postprocedure Evaluation (Signed)
Anesthesia Post Note  Patient: Isaac Stewart  Procedure(s) Performed: XI ROBOTIC ASSISTED SIGMOIDECTOMY (N/A Abdomen) PRIMARY RIGHT INCARCERATED INGUINAL HERNIA REPAIR (Right Inguinal)     Patient location during evaluation: PACU Anesthesia Type: General Level of consciousness: awake and alert and oriented Pain management: pain level controlled Vital Signs Assessment: post-procedure vital signs reviewed and stable Respiratory status: spontaneous breathing, nonlabored ventilation, respiratory function stable and patient connected to nasal cannula oxygen Cardiovascular status: blood pressure returned to baseline and stable Postop Assessment: no apparent nausea or vomiting Anesthetic complications: no    Last Vitals:  Vitals:   02/15/19 1100 02/15/19 1109  BP: (!) 179/83 (!) 168/72  Pulse: 65 67  Resp: 10 10  Temp:    SpO2: 98% 99%    Last Pain:  Vitals:   02/15/19 1100  TempSrc:   PainSc: Asleep                 Burnie Therien A.

## 2019-02-15 NOTE — H&P (Signed)
The patient is a 78 year old male who presents with a colonic mass. 78 year old male with uncontrolled hypertension and history of prostate cancer who presents to the office for evaluation of a newly diagnosed colon mass. He was noted to have a positive fecal occult blood test and colonoscopy was performed. This showed multiple polyps as well as a sigmoid colon mass which was biopsied. 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 CEA was normal. CT scan showed no signs of metastatic disease but these were done with out contrast due to an elevated creatinine of 1.8.   Problem List/Past Medical Mammie Lorenzo, LPN; 4/49/6759 1:63 AM) MASS OF COLON [phone])   Past Surgical History Mammie Lorenzo, LPN; 9/93/5701 7:79 AM) Cataract Surgery  Bilateral. Colon Polyp Removal - Colonoscopy  Prostate Surgery - Removal  Vasectomy   Diagnostic Studies History Mammie Lorenzo, LPN; 3/90/3009 2:33 AM) Colonoscopy  within last year  Allergies (Quesada, CMA; 01/01/2019 9:47 AM) No Known Allergies [01/01/2019]: No Known Drug Allergies [01/01/2019]: Allergies Reconciled   Medication History Leighton Ruff, MD; 0/06/6225 10:12 AM) Aspirin (81MG  Tablet Chewable, Oral) Active. Simvastatin (40MG  Tablet, Oral) Active. NIFEdipine ER (90MG  Tablet ER 24HR, Oral) Active. Glimepiride (4MG  Tablet, Oral) Active. Tyler Aas FlexTouch (200UNIT/ML Soln Pen-inj, Subcutaneous) Active. Vitamin D3 (25MCG Tablet, Oral) Active. Apple Cider Vinegar (188MG  Capsule, Oral) Active. MegaRed Omega-3 Krill Oil (500MG  Capsule, Oral) Active. EQL Cinnamon (500MG  Capsule, Oral) Active.   Social History Mammie Lorenzo, LPN; 3/33/5456 2:56 AM) Alcohol use  Occasional alcohol use. Caffeine use  Carbonated beverages, Coffee, Tea. No drug use  Tobacco use  Former smoker.  Family History Mammie Lorenzo, LPN; 3/89/3734 2:87 AM) Arthritis  Mother. Respiratory Condition   Brother, Father.  Other Problems Mammie Lorenzo, LPN; 6/81/1572 6:20 AM) Bladder Problems  Colon Cancer  Diabetes Mellitus  Enlarged Prostate  Hemorrhoids  High blood pressure  Hypercholesterolemia  Kidney Stone  Prostate Cancer     Review of Systems  General Not Present- Appetite Loss, Chills, Fatigue, Fever, Night Sweats, Weight Gain and Weight Loss. Skin Not Present- Change in Wart/Mole, Dryness, Hives, Jaundice, New Lesions, Non-Healing Wounds, Rash and Ulcer. HEENT Present- Hearing Loss and Wears glasses/contact lenses. Not Present- Earache, Hoarseness, Nose Bleed, Oral Ulcers, Ringing in the Ears, Seasonal Allergies, Sinus Pain, Sore Throat, Visual Disturbances and Yellow Eyes. Respiratory Present- Snoring. Not Present- Bloody sputum, Chronic Cough, Difficulty Breathing and Wheezing. Breast Not Present- Breast Mass, Breast Pain, Nipple Discharge and Skin Changes. Cardiovascular Not Present- Chest Pain, Difficulty Breathing Lying Down, Leg Cramps, Palpitations, Rapid Heart Rate, Shortness of Breath and Swelling of Extremities. Gastrointestinal Present- Bloody Stool and Hemorrhoids. Not Present- Abdominal Pain, Bloating, Change in Bowel Habits, Chronic diarrhea, Constipation, Difficulty Swallowing, Excessive gas, Gets full quickly at meals, Indigestion, Nausea, Rectal Pain and Vomiting. Male Genitourinary Present- Impotence. Not Present- Blood in Urine, Change in Urinary Stream, Frequency, Nocturia, Painful Urination, Urgency and Urine Leakage. Musculoskeletal Not Present- Back Pain, Joint Pain, Joint Stiffness, Muscle Pain, Muscle Weakness and Swelling of Extremities. Neurological Not Present- Decreased Memory, Fainting, Headaches, Numbness, Seizures, Tingling, Tremor, Trouble walking and Weakness. Psychiatric Not Present- Anxiety, Bipolar, Change in Sleep Pattern, Depression, Fearful and Frequent crying. Endocrine Not Present- Cold Intolerance, Excessive Hunger, Hair  Changes, Heat Intolerance, Hot flashes and New Diabetes. Hematology Present- Easy Bruising. Not Present- Blood Thinners, Excessive bleeding, Gland problems, HIV and Persistent Infections.  BP (!) 212/88   Pulse 69   Temp (!) 97.2 F (36.2 C) (Oral)   SpO2 96%  Physical Exam General Mental Status-Alert. General Appearance-Not in acute distress. Build & Nutrition-Well nourished. Posture-Normal posture. Gait-Normal.  Head and Neck Head-normocephalic, atraumatic with no lesions or palpable masses. Trachea-midline.  Chest and Lung Exam Chest and lung exam reveals -on auscultation, normal breath sounds, no adventitious sounds and normal vocal resonance.  Cardiovascular Cardiovascular examination reveals -normal heart sounds, regular rate and rhythm with no murmurs and no digital clubbing, cyanosis, edema, increased warmth or tenderness.  Abdomen Inspection Inspection of the abdomen reveals - Note: Periumbilical surgical scar. Palpation/Percussion Palpation and Percussion of the abdomen reveal - Soft, Non Tender, No Rigidity (guarding), No hepatosplenomegaly and No Palpable abdominal masses.  Neurologic Neurologic evaluation reveals -alert and oriented x 3 with no impairment of recent or remote memory, normal attention span and ability to concentrate, normal sensation and normal coordination.  Musculoskeletal Normal Exam - Bilateral-Upper Extremity Strength Normal and Lower Extremity Strength Normal.    Assessment & Plan Leighton Ruff MD; 0/09/711 10:09 AM) MASS OF COLON (R97.58) Impression: 78 year old male who presents to the office for evaluation of a newly diagnosed possible colon cancer. He was noted to have a mass in the sigmoid colon. Biopsies show high-grade dysplasia with possible invasion. His CT scan showed no signs of metastatic disease and CEA level was normal. He does have a surgical history of ventral hernia repair and prostate surgery  performed laparoscopically and robotically respectively. We discussed the need for sigmoidectomy to determine his final pathology. The surgery and anatomy were described to the patient as well as the risks of surgery and the possible complications. These include: Bleeding, deep abdominal infections and possible wound complications such as hernia and infection, damage to adjacent structures, leak of surgical connections, which can lead to other surgeries and possibly an ostomy, possible need for other procedures, such as abscess drains in radiology, possible prolonged hospital stay, possible diarrhea from removal of part of the colon, possible constipation from narcotics, possible bowel, bladder or sexual dysfunction if having rectal surgery, prolonged fatigue/weakness or appetite loss, possible early recurrence of of disease, possible complications of their medical problems such as heart disease or arrhythmias or lung problems, death (less than 1%). I believe the patient understands and wishes to proceed with the surgery.

## 2019-02-15 NOTE — Progress Notes (Signed)
Dr Royce Macadamia aware of BP in pacu. No orders at this time. Will cont to monitor.

## 2019-02-15 NOTE — Op Note (Signed)
02/15/2019  10:24 AM  PATIENT:  Isaac Stewart  78 y.o. male  Patient Care Team: Nickola Major as PCP - General (Internal Medicine)  PRE-OPERATIVE DIAGNOSIS:  COLON MASS  POST-OPERATIVE DIAGNOSIS:  COLON MASS, INCARCERATED RIGHT INGUINAL DIRECT HERNIA  PROCEDURE: XI ROBOTIC ASSISTED SIGMOIDECTOMY RIGHT INCARCERATED INGUINAL HERNIA PRIMARY REPAIR   Surgeon(s): Leighton Ruff, MD Michael Boston, MD  ASSISTANT: Dr Johney Maine   ANESTHESIA:   local and general  EBL: 78ml Total I/O In: 1740 [I.V.:1520; IV Piggyback:100] Out: 295 [Urine:275; Blood:20]  Delay start of Pharmacological VTE agent (>24hrs) due to surgical blood loss or risk of bleeding:  no  DRAINS: none   SPECIMEN:  Source of Specimen:  Sigmoid colon  DISPOSITION OF SPECIMEN:  PATHOLOGY  COUNTS:  YES  PLAN OF CARE: Admit to inpatient   PATIENT DISPOSITION:  PACU - hemodynamically stable.  INDICATION:    78 y.o. M with large sigmoid polyp concerning for possible cancer.  I recommended segmental resection:  The anatomy & physiology of the digestive tract was discussed.  The pathophysiology was discussed.  Natural history risks without surgery was discussed.   I worked to give an overview of the disease and the frequent need to have multispecialty involvement.  I feel the risks of no intervention will lead to serious problems that outweigh the operative risks; therefore, I recommended a partial colectomy to remove the pathology.  Laparoscopic & open techniques were discussed.   Risks such as bleeding, infection, abscess, leak, reoperation, possible ostomy, hernia, heart attack, death, and other risks were discussed.  I noted a good likelihood this will help address the problem.   Goals of post-operative recovery were discussed as well.    The patient expressed understanding & wished to proceed with surgery.  OR FINDINGS:   Patient had tattoo noted in proximal and mid sigmoid colon.  No obvious metastatic  disease on visceral parietal peritoneum or liver.  The anastomosis rests 15 cm from the anal verge by rigid proctoscopy.  DESCRIPTION:   Informed consent was confirmed.  The patient underwent general anaesthesia without difficulty.  The patient was positioned appropriately.  VTE prevention in place.  The patient's abdomen was clipped, prepped, & draped in a sterile fashion.  Surgical timeout confirmed our plan.  The patient was positioned in reverse Trendelenburg.  Abdominal entry was gained using a Varies needle in the LUQ.  Entry was clean.  I induced carbon dioxide insufflation.  An 75mm robotic port was placed in the RUQ.  Camera inspection revealed no injury.  Extra ports were carefully placed under direct laparoscopic visualization.  I laparoscopically reflected the greater omentum and the upper abdomen the small bowel in the upper abdomen. The patient was appropriately positioned and the robot was docked to the patient's left side.  Instruments were placed under direct visualization.    I mobilized the sigmoid colon off of the pelvic sidewall.  There were significant adhesions in the LLQ associated with previous inguinal hernia repair.  These were dissected free and the sigmoid was mobilized out of the pelvis.  I scored the base of peritoneum of the right side of the mesentery of the left colon from the ligament of Treitz to the peritoneal reflection of the mid rectum.  The patient had a right inguinal direct hernia incarcerated with pericolonic fat.  This was reduced and freed from the hernia sac.   I elevated the sigmoid mesentery and enetered into the retro-mesenteric plane. We were able to identify the  left ureter and gonadal vessels. We kept those posterior within the retroperitoneum and elevated the left colon mesentery off that. I did isolated IMA pedicle but did not ligate it yet.  I continued distally and got into the avascular plane posterior to the mesorectum. This allowed me to help  mobilize the rectum as well by freeing the mesorectum off the sacrum.  I mobilized the peritoneal coverings towards the peritoneal reflection on both the right and left sides of the rectum.  I could see the right and left ureters and stayed away from them.    I skeletonized the inferior mesenteric artery pedicle.  I went down to its takeoff from the aorta. After confirming the left ureter was out of the way, I went ahead and ligated the inferior mesenteric artery pedicle with bipolar robotic vessel sealer ~2cm above its takeoff from the aorta.  We ensured hemostasis. I skeletonized the mesorectum at the junction at the proximal rectum using blunt dissection & bipolar robotic vessel sealer.  I mobilized the left colon in a lateral to medial fashion off the line of Toldt up towards the splenic flexure to ensure good mobilization of the left colon to reach into the pelvis.  I decided to assess the patient's perfusion.  This was done by injecting firefly intravenously.  There was good perfusion noted to the descending sigmoid colon junction.  This was marked by tattoo.  Good perfusion was noted in the rectum.  We then inserted a blue load robotic stapler through the suprapubic port.  The rectosigmoid junction was divided using 2 stapler fires.  After this was complete we closed the direct inguinal hernia using #1 Novafil under direct laparoscopic visualization with 2 interrupted sutures.  Once this was complete the robotic instruments were removed and the robot was undocked.  The suprapubic port was enlarged into a Pfannenstiel incision and Alexis wound protector was placed.  The colon was brought out through this and a pursestring device was placed on the proximal sigmoid border previously marked with tattoo.  A 2-0 Prolene pursestring suture was created.  This was secured into place with 3-0 silk sutures.  The suture was tied around a 29 mm EEA anvil.  This was then placed back into the abdomen and the cap was  placed back onto the [place].  The abdomen was reinsufflated.  An anastomosis was created through the end of the rectal stump.  There was no tension noted on the anastomosis.  There was no leak when tested with insufflation under irrigation.  The anastomosis rest approximately 15 cm from the anal verge.  The pelvis was irrigated.  Hemostasis was good.  The abdomen was then desufflated and all ports were removed.  We then switched to clean gowns, drapes, instruments and gloves.  The peritoneum of the Pfannenstiel incision was closed using a running 0 Vicryl suture.  The fascia was closed using interrupted #1 Novafil sutures.  Subcutaneous tissue was reapproximated using a running 2-0 Vicryl suture.  The skin was closed with a running 4-0 Vicryl subcuticular suture.  A sterile dressing was applied.  The remaining port sites were closed using interrupted 4-0 Vicryl sutures and Dermabond.  Patient was then awakened from anesthesia and sent to the postanesthesia care unit in stable condition.  All counts were correct per operating room staff.  An MD assistant was necessary for tissue manipulation, retraction and positioning due to the complexity of the case and hospital policies

## 2019-02-15 NOTE — Anesthesia Procedure Notes (Signed)
Procedure Name: Intubation Date/Time: 02/15/2019 7:38 AM Performed by: Claudia Desanctis, CRNA Pre-anesthesia Checklist: Patient identified, Emergency Drugs available, Suction available and Patient being monitored Patient Re-evaluated:Patient Re-evaluated prior to induction Oxygen Delivery Method: Circle system utilized Preoxygenation: Pre-oxygenation with 100% oxygen Induction Type: IV induction, Rapid sequence and Cricoid Pressure applied Laryngoscope Size: 2 and Miller Grade View: Grade I Tube type: Oral Tube size: 7.5 mm Number of attempts: 1 Airway Equipment and Method: Stylet Placement Confirmation: ETT inserted through vocal cords under direct vision,  positive ETCO2 and breath sounds checked- equal and bilateral Secured at: 23 cm Tube secured with: Tape Dental Injury: Teeth and Oropharynx as per pre-operative assessment

## 2019-02-16 ENCOUNTER — Encounter (HOSPITAL_COMMUNITY): Payer: Self-pay | Admitting: General Surgery

## 2019-02-16 LAB — CBC
HCT: 35.4 % — ABNORMAL LOW (ref 39.0–52.0)
Hemoglobin: 10.7 g/dL — ABNORMAL LOW (ref 13.0–17.0)
MCH: 28.3 pg (ref 26.0–34.0)
MCHC: 30.2 g/dL (ref 30.0–36.0)
MCV: 93.7 fL (ref 80.0–100.0)
Platelets: 205 10*3/uL (ref 150–400)
RBC: 3.78 MIL/uL — AB (ref 4.22–5.81)
RDW: 14 % (ref 11.5–15.5)
WBC: 7.4 10*3/uL (ref 4.0–10.5)
nRBC: 0 % (ref 0.0–0.2)

## 2019-02-16 LAB — BASIC METABOLIC PANEL
ANION GAP: 7 (ref 5–15)
BUN: 21 mg/dL (ref 8–23)
CO2: 24 mmol/L (ref 22–32)
Calcium: 8 mg/dL — ABNORMAL LOW (ref 8.9–10.3)
Chloride: 110 mmol/L (ref 98–111)
Creatinine, Ser: 2.11 mg/dL — ABNORMAL HIGH (ref 0.61–1.24)
GFR calc Af Amer: 34 mL/min — ABNORMAL LOW (ref >60)
GFR calc non Af Amer: 30 mL/min — ABNORMAL LOW (ref >60)
Glucose, Bld: 143 mg/dL — ABNORMAL HIGH (ref 70–99)
Potassium: 4.1 mmol/L (ref 3.5–5.1)
SODIUM: 141 mmol/L (ref 135–145)

## 2019-02-16 LAB — GLUCOSE, CAPILLARY
Glucose-Capillary: 121 mg/dL — ABNORMAL HIGH (ref 70–99)
Glucose-Capillary: 95 mg/dL (ref 70–99)
Glucose-Capillary: 142 mg/dL — ABNORMAL HIGH (ref 70–99)
Glucose-Capillary: 124 mg/dL — ABNORMAL HIGH (ref 70–99)

## 2019-02-16 NOTE — Progress Notes (Signed)
1 Day Post-Op robotic sigmoidectomy Subjective: Pt doing well.  Min pain, taking scheduled tylenol only.  Passing flatus.  Tolerating liquids  Objective: Vital signs in last 24 hours: Temp:  [97.3 F (36.3 C)-98.5 F (36.9 C)] 97.6 F (36.4 C) (03/06 0547) Pulse Rate:  [62-75] 62 (03/06 0547) Resp:  [10-18] 16 (03/06 0547) BP: (139-190)/(62-83) 149/78 (03/06 0547) SpO2:  [93 %-99 %] 97 % (03/06 0547) Weight:  [98 kg] 98 kg (03/05 1205)   Intake/Output from previous day: 03/05 0701 - 03/06 0700 In: 3585.2 [P.O.:450; I.V.:2648.3; IV Piggyback:486.9] Out: 2545 [Urine:2525; Blood:20] Intake/Output this shift: No intake/output data recorded.   General appearance: alert and cooperative GI: normal findings: soft, non-tender  Incision: no significant drainage  Lab Results:  Recent Labs    02/16/19 0424  WBC 7.4  HGB 10.7*  HCT 35.4*  PLT 205   BMET Recent Labs    02/16/19 0424  NA 141  K 4.1  CL 110  CO2 24  GLUCOSE 143*  BUN 21  CREATININE 2.11*  CALCIUM 8.0*   PT/INR No results for input(s): LABPROT, INR in the last 72 hours. ABG No results for input(s): PHART, HCO3 in the last 72 hours.  Invalid input(s): PCO2, PO2  MEDS, Scheduled . acetaminophen  1,000 mg Oral Q6H  . alvimopan  12 mg Oral BID  . cloNIDine  0.2 mg Oral BID  . diphenhydrAMINE  50 mg Oral q morning - 10a  . enoxaparin (LOVENOX) injection  40 mg Subcutaneous Q24H  . feeding supplement  237 mL Oral BID BM  . fenofibrate  160 mg Oral q morning - 10a  . gabapentin  300 mg Oral BID  . glimepiride  4 mg Oral Q breakfast  . insulin aspart  0-20 Units Subcutaneous TID WC  . insulin aspart  0-5 Units Subcutaneous QHS  . NIFEdipine  90 mg Oral q morning - 10a  . saccharomyces boulardii  250 mg Oral BID  . simvastatin  40 mg Oral QHS    Studies/Results: No results found.  Assessment: s/p Procedure(s): XI ROBOTIC ASSISTED SIGMOIDECTOMY PRIMARY RIGHT INCARCERATED INGUINAL HERNIA  REPAIR Patient Active Problem List   Diagnosis Date Noted  . Colonic mass 02/15/2019    Expected post op course  Plan: d/c foley Advance diet SLIV  Possible d/c tom   LOS: 1 day     .Rosario Adie, [facility], [place] [phone]   02/16/2019 7:28 AM

## 2019-02-16 NOTE — Discharge Instructions (Addendum)
ABDOMINAL SURGERY: POST OP INSTRUCTIONS  1. DIET: Follow a light bland diet the first 24 hours after arrival home, such as soup, liquids, crackers, etc.  Be sure to include lots of fluids daily.  Avoid fast food or heavy meals as your are more likely to get nauseated.  Do not eat any uncooked fruits or vegetables for the next 2 weeks as your colon heals. 2. Take your usually prescribed home medications unless otherwise directed. 3. PAIN CONTROL: a. Pain is best controlled by a usual combination of three different methods TOGETHER: i. Ice/Heat ii. Over the counter pain medication iii. Prescription pain medication b. Most patients will experience some swelling and bruising around the incisions.  Ice packs or heating pads (30-60 minutes up to 6 times a day) will help. Use ice for the first few days to help decrease swelling and bruising, then switch to heat to help relax tight/sore spots and speed recovery.  Some people prefer to use ice alone, heat alone, alternating between ice & heat.  Experiment to what works for you.  Swelling and bruising can take several weeks to resolve.   c. It is helpful to take an over-the-counter pain medication regularly for the first few weeks.  Choose one of the following that works best for you: i. Naproxen (Aleve, etc)  Two 220mg  tabs twice a day ii. Ibuprofen (Advil, etc) Three 200mg  tabs four times a day (every meal & bedtime) iii. Acetaminophen (Tylenol, etc) 500-650mg  four times a day (every meal & bedtime)  4. Avoid getting constipated.  Between the surgery and the pain medications, it is common to experience some constipation.  Increasing fluid intake and taking a fiber supplement (such as Metamucil, Citrucel, FiberCon, MiraLax, etc) 1-2 times a day regularly will usually help prevent this problem from occurring.  A mild laxative (prune juice, Milk of Magnesia, MiraLax, etc) should be taken according to package directions if there are no bowel movements after 48  hours.   5. Watch out for diarrhea.  If you have many loose bowel movements, simplify your diet to bland foods & liquids for a few days.  Stop any stool softeners and decrease your fiber supplement.  Switching to mild anti-diarrheal medications (Kayopectate, Pepto Bismol) can help.  If this worsens or does not improve, please call us. 6. Wash / shower every day.  You may shower over the incision / wound.  Avoid baths until the skin is fully healed.  Continue to shower over incision(s) after the dressing is off. 7. Remove your waterproof bandages 5 days after surgery.  You may leave the incision open to air.  You may replace a dressing/Band-Aid to cover the incision for comfort if you wish. 8. ACTIVITIES as tolerated:   a. You may resume regular (light) daily activities beginning the next day--such as daily self-care, walking, climbing stairs--gradually increasing activities as tolerated.  If you can walk 30 minutes without difficulty, it is safe to try more intense activity such as jogging, treadmill, bicycling, low-impact aerobics, swimming, etc. b. Save the most intensive and strenuous activity for last such as sit-ups, heavy lifting, contact sports, etc  Refrain from any heavy lifting or straining until you are off narcotics for pain control.   c. DO NOT PUSH THROUGH PAIN.  Let pain be your guide: If it hurts to do something, don't do it.  Pain is your body warning you to avoid that activity for another week until the pain goes down. d. You may drive when you  are no longer taking prescription pain medication, you can comfortably wear a seatbelt, and you can safely maneuver your car and apply brakes. e. Dennis Bast may have sexual intercourse when it is comfortable.  9. FOLLOW UP in our office with Dr. Marcello Moores in 2 to 3 weeks.   Call for an appt. a. Please call [facility] at [phone] to set up an appointment to see your surgeon in the office for a follow-up appointment approximately 1-2 weeks after your  surgery. b. Make sure that you call for this appointment the day you arrive home to insure a convenient appointment time. 10. IF YOU HAVE DISABILITY OR FAMILY LEAVE FORMS, BRING THEM TO THE OFFICE FOR PROCESSING.  DO NOT GIVE THEM TO YOUR DOCTOR.   WHEN TO CALL us [phone]: 1. Poor pain control 2. Reactions / problems with new medications (rash/itching, nausea, etc)  3. Fever over 101.5 F (38.5 C) 4. Inability to urinate 5. Nausea and/or vomiting 6. Worsening swelling or bruising 7. Continued bleeding from incision. 8. Increased pain, redness, or drainage from the incision  The clinic staff is available to answer your questions during regular business hours (8:30am-5pm).  Please dont hesitate to call and ask to speak to one of our nurses for clinical concerns.   A surgeon from [facility] is always on call at the hospitals   If you have a medical emergency, go to the nearest emergency room or call 911.    [facility], [address], [address], [place], [place]  [address] ? MAIN: [phone] ? TOLL FREE: [phone] ? FAX [phone] [email or web address]

## 2019-02-17 LAB — CBC
HCT: 35.1 % — ABNORMAL LOW (ref 39.0–52.0)
Hemoglobin: 10.6 g/dL — ABNORMAL LOW (ref 13.0–17.0)
MCH: 28.6 pg (ref 26.0–34.0)
MCHC: 30.2 g/dL (ref 30.0–36.0)
MCV: 94.6 fL (ref 80.0–100.0)
Platelets: 208 10*3/uL (ref 150–400)
RBC: 3.71 MIL/uL — ABNORMAL LOW (ref 4.22–5.81)
RDW: 13.9 % (ref 11.5–15.5)
WBC: 7.2 10*3/uL (ref 4.0–10.5)
nRBC: 0 % (ref 0.0–0.2)

## 2019-02-17 LAB — BASIC METABOLIC PANEL
Anion gap: 6 (ref 5–15)
BUN: 26 mg/dL — ABNORMAL HIGH (ref 8–23)
CALCIUM: 8.5 mg/dL — AB (ref 8.9–10.3)
CO2: 25 mmol/L (ref 22–32)
Chloride: 110 mmol/L (ref 98–111)
Creatinine, Ser: 2.11 mg/dL — ABNORMAL HIGH (ref 0.61–1.24)
GFR calc Af Amer: 34 mL/min — ABNORMAL LOW (ref >60)
GFR calc non Af Amer: 30 mL/min — ABNORMAL LOW (ref >60)
Glucose, Bld: 124 mg/dL — ABNORMAL HIGH (ref 70–99)
Potassium: 4.7 mmol/L (ref 3.5–5.1)
Sodium: 141 mmol/L (ref 135–145)

## 2019-02-17 LAB — GLUCOSE, CAPILLARY: Glucose-Capillary: 115 mg/dL — ABNORMAL HIGH (ref 70–99)

## 2019-02-17 NOTE — Discharge Summary (Signed)
Physician Discharge Summary  Patient ID:  Isaac Stewart  MRN: [id number]  DOB/AGE: Dec 22, 1940 78 y.o.  Admit date: 02/15/2019 Discharge date: 02/17/2019  Discharge Diagnoses:  1.  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  Right inguinal hernia 3.  HTN 4.  History of prostate cancer 5.  CKD 6.  DM   Active Problems:   Colonic mass  Operation: Procedure(s): XI ROBOTIC ASSISTED SIGMOIDECTOMY, PRIMARY RIGHT INCARCERATED INGUINAL HERNIA REPAIR on 02/15/2019 - Dr. Marcello Moores  Discharged Condition: good  Hospital Course: Isaac Stewart is an 78 y.o. male whose primary care physician is Nickola Major and who was admitted 02/15/2019 with a chief complaint of colonic polyp.   He was brought to the operating room on 02/15/2019 and underwent  XI ROBOTIC ASSISTED SIGMOIDECTOMY, PRIMARY RIGHT INCARCERATED INGUINAL HERNIA REPAIR .   The discharge instructions were reviewed with the patient.  Consults: None  Significant Diagnostic Studies: Results for orders placed or performed during the hospital encounter of 02/15/19  Glucose, capillary  Result Value Ref Range   Glucose-Capillary 107 (H) 70 - 99 mg/dL  Glucose, capillary  Result Value Ref Range   Glucose-Capillary 192 (H) 70 - 99 mg/dL  Glucose, capillary  Result Value Ref Range   Glucose-Capillary 175 (H) 70 - 99 mg/dL  Glucose, capillary  Result Value Ref Range   Glucose-Capillary 223 (H) 70 - 99 mg/dL  CBC  Result Value Ref Range   WBC 7.4 4.0 - 10.5 K/uL   RBC 3.78 (L) 4.22 - 5.81 MIL/uL   Hemoglobin 10.7 (L) 13.0 - 17.0 g/dL   HCT 35.4 (L) 39.0 - 52.0 %   MCV 93.7 80.0 - 100.0 fL   MCH 28.3 26.0 - 34.0 pg   MCHC 30.2 30.0 - 36.0 g/dL   RDW 14.0 11.5 - 15.5 %   Platelets 205 150 - 400 K/uL   nRBC 0.0 0.0 - 0.2 %  Basic metabolic panel  Result Value Ref Range   Sodium 141 135 - 145 mmol/L   Potassium 4.1 3.5 - 5.1 mmol/L   Chloride 110 98 - 111  mmol/L   CO2 24 22 - 32 mmol/L   Glucose, Bld 143 (H) 70 - 99 mg/dL   BUN 21 8 - 23 mg/dL   Creatinine, Ser 2.11 (H) 0.61 - 1.24 mg/dL   Calcium 8.0 (L) 8.9 - 10.3 mg/dL   GFR calc non Af Amer 30 (L) >60 mL/min   GFR calc Af Amer 34 (L) >60 mL/min   Anion gap 7 5 - 15  Glucose, capillary  Result Value Ref Range   Glucose-Capillary 200 (H) 70 - 99 mg/dL  Glucose, capillary  Result Value Ref Range   Glucose-Capillary 121 (H) 70 - 99 mg/dL  Glucose, capillary  Result Value Ref Range   Glucose-Capillary 95 70 - 99 mg/dL  CBC  Result Value Ref Range   WBC 7.2 4.0 - 10.5 K/uL   RBC 3.71 (L) 4.22 - 5.81 MIL/uL   Hemoglobin 10.6 (L) 13.0 - 17.0 g/dL   HCT 35.1 (L) 39.0 - 52.0 %   MCV 94.6 80.0 - 100.0 fL   MCH 28.6 26.0 - 34.0 pg   MCHC 30.2 30.0 - 36.0 g/dL   RDW 13.9 11.5 - 15.5 %   Platelets 208 150 - 400 K/uL   nRBC 0.0 0.0 - 0.2 %  Basic metabolic panel  Result Value Ref Range   Sodium 141 135 -  145 mmol/L   Potassium 4.7 3.5 - 5.1 mmol/L   Chloride 110 98 - 111 mmol/L   CO2 25 22 - 32 mmol/L   Glucose, Bld 124 (H) 70 - 99 mg/dL   BUN 26 (H) 8 - 23 mg/dL   Creatinine, Ser 2.11 (H) 0.61 - 1.24 mg/dL   Calcium 8.5 (L) 8.9 - 10.3 mg/dL   GFR calc non Af Amer 30 (L) >60 mL/min   GFR calc Af Amer 34 (L) >60 mL/min   Anion gap 6 5 - 15  Glucose, capillary  Result Value Ref Range   Glucose-Capillary 142 (H) 70 - 99 mg/dL  Glucose, capillary  Result Value Ref Range   Glucose-Capillary 124 (H) 70 - 99 mg/dL  Glucose, capillary  Result Value Ref Range   Glucose-Capillary 115 (H) 70 - 99 mg/dL    No results found.  Discharge Exam:  Vitals:   02/16/19 2215 02/17/19 0550  BP: (!) 134/55 132/65  Pulse: 62 64  Resp: 14 14  Temp: 98.7 F (37.1 C) 97.8 F (36.6 C)  SpO2: 96% 96%    General: WN older WM who is alert and generally healthy appearing.  Lungs: Clear to auscultation and symmetric breath sounds. Heart:  RRR. No murmur or rub. Abdomen: Soft. No mass. No  hernia. Normal bowel sounds.   Incisions look good.  Discharge Medications:   Allergies as of 02/17/2019   No Known Allergies     Medication List    TAKE these medications   APPLE CIDER VINEGAR PO Take 2 tablets by mouth daily.   aspirin EC 81 MG tablet Take 81 mg by mouth at bedtime.   CINNAMON PO Take 2 tablets by mouth daily.   cloNIDine 0.2 MG tablet Commonly known as:  CATAPRES Take 0.2 mg by mouth 2 (two) times daily.   diphenhydrAMINE 25 MG tablet Commonly known as:  BENADRYL Take 50 mg by mouth every morning.   fenofibrate 160 MG tablet Take 160 mg by mouth every morning.   Fifty50 Pen Needles 32G X 4 MM Misc Generic drug:  Insulin Pen Needle Use once daily   glimepiride 4 MG tablet Commonly known as:  AMARYL Take 4 mg by mouth daily with breakfast.   insulin degludec 100 UNIT/ML Sopn FlexTouch Pen Commonly known as:  TRESIBA Inject 38 Units into the skin every morning.   KRILL OIL PO Take 253 mg by mouth daily.   [employer] TEST STRIPS test strip Generic drug:  glucose blood One touch enio IQ test strips.  Test blood sugar twice a day.  Diagnosis code:  E11.9.   multivitamin capsule Take 1 capsule by mouth daily.   NIFEdipine 90 MG 24 hr tablet Commonly known as:  PROCARDIA XL/NIFEDICAL-XL Take 90 mg by mouth every morning.   simvastatin 40 MG tablet Commonly known as:  ZOCOR Take 40 mg by mouth at bedtime.   UNABLE TO FIND Vitamin d 3 50 mg daily       Disposition: Discharge disposition: 01-Home or Self Care       Discharge Instructions    Diet - low sodium heart healthy   Complete by:  As directed    Increase activity slowly   Complete by:  As directed       Follow-up Information    Leighton Ruff, MD. Schedule an appointment as soon as possible for a visit in 2 week(s).   Specialty:  General Surgery Contact information: [place]  [phone]            Signed: Alphonsa Overall,  M.D., [facility] Surgery Office:  [phone]  02/17/2019, 9:21 AM

## 2019-04-30 DIAGNOSIS — W57XXXA Bitten or stung by nonvenomous insect and other nonvenomous arthropods, initial encounter: Secondary | ICD-10-CM | POA: Diagnosis not present

## 2019-04-30 DIAGNOSIS — L03115 Cellulitis of right lower limb: Secondary | ICD-10-CM | POA: Diagnosis not present

## 2019-04-30 DIAGNOSIS — L299 Pruritus, unspecified: Secondary | ICD-10-CM | POA: Diagnosis not present

## 2019-05-09 DIAGNOSIS — I129 Hypertensive chronic kidney disease with stage 1 through stage 4 chronic kidney disease, or unspecified chronic kidney disease: Secondary | ICD-10-CM | POA: Diagnosis not present

## 2019-05-09 DIAGNOSIS — N183 Chronic kidney disease, stage 3 (moderate): Secondary | ICD-10-CM | POA: Diagnosis not present

## 2019-05-21 ENCOUNTER — Other Ambulatory Visit: Payer: Self-pay | Admitting: Nephrology

## 2019-05-21 DIAGNOSIS — N183 Chronic kidney disease, stage 3 unspecified: Secondary | ICD-10-CM

## 2019-05-25 ENCOUNTER — Ambulatory Visit
Admission: RE | Admit: 2019-05-25 | Discharge: 2019-05-25 | Disposition: A | Discharge status: Home / Self Care | Payer: Medicare Other | Source: Ambulatory Visit | Attending: Nephrology | Admitting: Nephrology

## 2019-05-25 DIAGNOSIS — N281 Cyst of kidney, acquired: Secondary | ICD-10-CM | POA: Diagnosis not present

## 2019-05-25 DIAGNOSIS — N183 Chronic kidney disease, stage 3 unspecified: Secondary | ICD-10-CM

## 2019-06-26 DIAGNOSIS — Z794 Long term (current) use of insulin: Secondary | ICD-10-CM | POA: Diagnosis not present

## 2019-06-26 DIAGNOSIS — E785 Hyperlipidemia, unspecified: Secondary | ICD-10-CM | POA: Diagnosis not present

## 2019-06-26 DIAGNOSIS — I10 Essential (primary) hypertension: Secondary | ICD-10-CM | POA: Diagnosis not present

## 2019-06-26 DIAGNOSIS — E119 Type 2 diabetes mellitus without complications: Secondary | ICD-10-CM | POA: Diagnosis not present

## 2019-07-09 DIAGNOSIS — N183 Chronic kidney disease, stage 3 (moderate): Secondary | ICD-10-CM | POA: Diagnosis not present

## 2019-07-09 DIAGNOSIS — I129 Hypertensive chronic kidney disease with stage 1 through stage 4 chronic kidney disease, or unspecified chronic kidney disease: Secondary | ICD-10-CM | POA: Diagnosis not present

## 2019-07-13 DIAGNOSIS — L570 Actinic keratosis: Secondary | ICD-10-CM | POA: Diagnosis not present

## 2019-07-13 DIAGNOSIS — D0439 Carcinoma in situ of skin of other parts of face: Secondary | ICD-10-CM | POA: Diagnosis not present

## 2019-07-13 DIAGNOSIS — X32XXXD Exposure to sunlight, subsequent encounter: Secondary | ICD-10-CM | POA: Diagnosis not present

## 2019-07-26 DIAGNOSIS — E119 Type 2 diabetes mellitus without complications: Secondary | ICD-10-CM | POA: Diagnosis not present

## 2019-08-03 DIAGNOSIS — C61 Malignant neoplasm of prostate: Secondary | ICD-10-CM | POA: Diagnosis not present

## 2019-08-24 DIAGNOSIS — Z08 Encounter for follow-up examination after completed treatment for malignant neoplasm: Secondary | ICD-10-CM | POA: Diagnosis not present

## 2019-08-24 DIAGNOSIS — L570 Actinic keratosis: Secondary | ICD-10-CM | POA: Diagnosis not present

## 2019-08-24 DIAGNOSIS — Z85828 Personal history of other malignant neoplasm of skin: Secondary | ICD-10-CM | POA: Diagnosis not present

## 2019-08-24 DIAGNOSIS — D0462 Carcinoma in situ of skin of left upper limb, including shoulder: Secondary | ICD-10-CM | POA: Diagnosis not present

## 2019-08-24 DIAGNOSIS — X32XXXD Exposure to sunlight, subsequent encounter: Secondary | ICD-10-CM | POA: Diagnosis not present

## 2019-10-10 DIAGNOSIS — Z85828 Personal history of other malignant neoplasm of skin: Secondary | ICD-10-CM | POA: Diagnosis not present

## 2019-10-10 DIAGNOSIS — R69 Illness, unspecified: Secondary | ICD-10-CM | POA: Diagnosis not present

## 2019-10-10 DIAGNOSIS — Z08 Encounter for follow-up examination after completed treatment for malignant neoplasm: Secondary | ICD-10-CM | POA: Diagnosis not present

## 2019-12-24 DIAGNOSIS — U071 COVID-19: Secondary | ICD-10-CM

## 2019-12-24 HISTORY — DX: COVID-19: U07.1

## 2019-12-27 ENCOUNTER — Telehealth: Payer: Self-pay | Admitting: Unknown Physician Specialty

## 2019-12-27 ENCOUNTER — Encounter (HOSPITAL_COMMUNITY): Payer: Self-pay | Admitting: Emergency Medicine

## 2019-12-27 ENCOUNTER — Inpatient Hospital Stay (HOSPITAL_COMMUNITY)
Admission: EM | Admit: 2019-12-27 | Discharge: 2019-12-31 | DRG: 177 | Disposition: A | Discharge status: Home / Self Care | Payer: HMO | Source: Ambulatory Visit | Attending: Internal Medicine | Admitting: Internal Medicine

## 2019-12-27 ENCOUNTER — Other Ambulatory Visit: Payer: Self-pay

## 2019-12-27 ENCOUNTER — Inpatient Hospital Stay (HOSPITAL_COMMUNITY): Payer: HMO

## 2019-12-27 ENCOUNTER — Telehealth: Payer: Self-pay

## 2019-12-27 DIAGNOSIS — Z8551 Personal history of malignant neoplasm of bladder: Secondary | ICD-10-CM | POA: Diagnosis not present

## 2019-12-27 DIAGNOSIS — Z79899 Other long term (current) drug therapy: Secondary | ICD-10-CM

## 2019-12-27 DIAGNOSIS — D631 Anemia in chronic kidney disease: Secondary | ICD-10-CM | POA: Diagnosis present

## 2019-12-27 DIAGNOSIS — E11649 Type 2 diabetes mellitus with hypoglycemia without coma: Secondary | ICD-10-CM | POA: Diagnosis not present

## 2019-12-27 DIAGNOSIS — E663 Overweight: Secondary | ICD-10-CM | POA: Diagnosis present

## 2019-12-27 DIAGNOSIS — E8770 Fluid overload, unspecified: Secondary | ICD-10-CM | POA: Diagnosis not present

## 2019-12-27 DIAGNOSIS — J1282 Pneumonia due to coronavirus disease 2019: Secondary | ICD-10-CM

## 2019-12-27 DIAGNOSIS — Z6830 Body mass index (BMI) 30.0-30.9, adult: Secondary | ICD-10-CM | POA: Diagnosis not present

## 2019-12-27 DIAGNOSIS — J9601 Acute respiratory failure with hypoxia: Secondary | ICD-10-CM | POA: Diagnosis present

## 2019-12-27 DIAGNOSIS — E1165 Type 2 diabetes mellitus with hyperglycemia: Secondary | ICD-10-CM | POA: Diagnosis present

## 2019-12-27 DIAGNOSIS — E1122 Type 2 diabetes mellitus with diabetic chronic kidney disease: Secondary | ICD-10-CM | POA: Diagnosis present

## 2019-12-27 DIAGNOSIS — E872 Acidosis: Secondary | ICD-10-CM | POA: Diagnosis present

## 2019-12-27 DIAGNOSIS — E785 Hyperlipidemia, unspecified: Secondary | ICD-10-CM | POA: Diagnosis present

## 2019-12-27 DIAGNOSIS — N1832 Chronic kidney disease, stage 3b: Secondary | ICD-10-CM | POA: Diagnosis present

## 2019-12-27 DIAGNOSIS — N179 Acute kidney failure, unspecified: Secondary | ICD-10-CM | POA: Diagnosis present

## 2019-12-27 DIAGNOSIS — I129 Hypertensive chronic kidney disease with stage 1 through stage 4 chronic kidney disease, or unspecified chronic kidney disease: Secondary | ICD-10-CM | POA: Diagnosis present

## 2019-12-27 DIAGNOSIS — Z7982 Long term (current) use of aspirin: Secondary | ICD-10-CM

## 2019-12-27 DIAGNOSIS — Z794 Long term (current) use of insulin: Secondary | ICD-10-CM

## 2019-12-27 DIAGNOSIS — R0602 Shortness of breath: Secondary | ICD-10-CM

## 2019-12-27 DIAGNOSIS — U071 COVID-19: Principal | ICD-10-CM | POA: Diagnosis present

## 2019-12-27 DIAGNOSIS — Z8249 Family history of ischemic heart disease and other diseases of the circulatory system: Secondary | ICD-10-CM

## 2019-12-27 DIAGNOSIS — N184 Chronic kidney disease, stage 4 (severe): Secondary | ICD-10-CM

## 2019-12-27 LAB — COMPREHENSIVE METABOLIC PANEL
ALT: 25 U/L (ref 0–44)
AST: 31 U/L (ref 15–41)
Albumin: 3.1 g/dL — ABNORMAL LOW (ref 3.5–5.0)
Alkaline Phosphatase: 55 U/L (ref 38–126)
Anion gap: 14 (ref 5–15)
BUN: 39 mg/dL — ABNORMAL HIGH (ref 8–23)
CO2: 22 mmol/L (ref 22–32)
Calcium: 9.1 mg/dL (ref 8.9–10.3)
Chloride: 101 mmol/L (ref 98–111)
Creatinine, Ser: 2.92 mg/dL — ABNORMAL HIGH (ref 0.61–1.24)
GFR calc Af Amer: 23 mL/min — ABNORMAL LOW (ref >60)
GFR calc non Af Amer: 20 mL/min — ABNORMAL LOW (ref >60)
Glucose, Bld: 157 mg/dL — ABNORMAL HIGH (ref 70–99)
Potassium: 4.4 mmol/L (ref 3.5–5.1)
Sodium: 137 mmol/L (ref 135–145)
Total Bilirubin: 0.8 mg/dL (ref 0.3–1.2)
Total Protein: 6.7 g/dL (ref 6.5–8.1)

## 2019-12-27 LAB — FIBRINOGEN: Fibrinogen: 695 mg/dL — ABNORMAL HIGH (ref 210–475)

## 2019-12-27 LAB — PROCALCITONIN: Procalcitonin: 0.31 ng/mL

## 2019-12-27 LAB — CBC
HCT: 35.1 % — ABNORMAL LOW (ref 39.0–52.0)
Hemoglobin: 11.1 g/dL — ABNORMAL LOW (ref 13.0–17.0)
MCH: 29.1 pg (ref 26.0–34.0)
MCHC: 31.6 g/dL (ref 30.0–36.0)
MCV: 92.1 fL (ref 80.0–100.0)
Platelets: 231 10*3/uL (ref 150–400)
RBC: 3.81 MIL/uL — ABNORMAL LOW (ref 4.22–5.81)
RDW: 13.5 % (ref 11.5–15.5)
WBC: 7.9 10*3/uL (ref 4.0–10.5)
nRBC: 0 % (ref 0.0–0.2)

## 2019-12-27 LAB — D-DIMER, QUANTITATIVE: D-Dimer, Quant: 1.66 ug/mL-FEU — ABNORMAL HIGH (ref 0.00–0.50)

## 2019-12-27 LAB — C-REACTIVE PROTEIN: CRP: 27.6 mg/dL — ABNORMAL HIGH (ref <1.0)

## 2019-12-27 LAB — HEMOGLOBIN A1C
Hgb A1c MFr Bld: 8.2 % — ABNORMAL HIGH (ref 4.8–5.6)
Mean Plasma Glucose: 188.64 mg/dL

## 2019-12-27 LAB — LACTIC ACID, PLASMA: Lactic Acid, Venous: 1.6 mmol/L (ref 0.5–1.9)

## 2019-12-27 LAB — TRIGLYCERIDES: Triglycerides: 147 mg/dL (ref <150)

## 2019-12-27 LAB — POC SARS CORONAVIRUS 2 AG -  ED
SARS Coronavirus 2 Ag: POSITIVE — AB
SARS Coronavirus 2 Ag: POSITIVE — AB

## 2019-12-27 LAB — LACTATE DEHYDROGENASE: LDH: 203 U/L — ABNORMAL HIGH (ref 98–192)

## 2019-12-27 LAB — FERRITIN: Ferritin: 245 ng/mL (ref 24–336)

## 2019-12-27 LAB — ABO/RH: ABO/RH(D): O NEG

## 2019-12-27 MED ORDER — APPLE CIDER VINEGAR 500 MG PO TABS: ORAL_TABLET | Freq: Every day | ORAL | Status: DC

## 2019-12-27 MED ORDER — SODIUM CHLORIDE 0.9 % IV BOLUS
500.0000 mL | Freq: Once | INTRAVENOUS | Status: AC
  Administered 2019-12-27: 17:00:00 500 mL via INTRAVENOUS

## 2019-12-27 MED ORDER — FENOFIBRATE 160 MG PO TABS
160.0000 mg | ORAL_TABLET | Freq: Every morning | ORAL | Status: DC
  Administered 2019-12-28 – 2019-12-31 (×4): 160 mg via ORAL
  Filled 2019-12-27 (×5): qty 1

## 2019-12-27 MED ORDER — ASPIRIN EC 81 MG PO TBEC
81.0000 mg | DELAYED_RELEASE_TABLET | Freq: Every day | ORAL | Status: DC
  Administered 2019-12-27 – 2019-12-30 (×4): 81 mg via ORAL
  Filled 2019-12-27 (×4): qty 1

## 2019-12-27 MED ORDER — CINNAMON 500 MG PO CAPS: ORAL_CAPSULE | Freq: Every day | ORAL | Status: DC

## 2019-12-27 MED ORDER — INSULIN ASPART 100 UNIT/ML ~~LOC~~ SOLN
0.0000 [IU] | Freq: Three times a day (TID) | SUBCUTANEOUS | Status: DC
  Administered 2019-12-28: 3 [IU] via SUBCUTANEOUS
  Administered 2019-12-28: 5 [IU] via SUBCUTANEOUS
  Administered 2019-12-28 – 2019-12-29 (×2): 3 [IU] via SUBCUTANEOUS
  Administered 2019-12-29: 5 [IU] via SUBCUTANEOUS
  Administered 2019-12-29: 3 [IU] via SUBCUTANEOUS
  Administered 2019-12-30: 5 [IU] via SUBCUTANEOUS
  Administered 2019-12-30: 2 [IU] via SUBCUTANEOUS
  Administered 2019-12-31: 3 [IU] via SUBCUTANEOUS

## 2019-12-27 MED ORDER — SODIUM CHLORIDE 0.9 % IV SOLN
200.0000 mg | Freq: Once | INTRAVENOUS | Status: AC
  Administered 2019-12-27: 200 mg via INTRAVENOUS
  Filled 2019-12-27: qty 40

## 2019-12-27 MED ORDER — MEGARED OMEGA-3 KRILL OIL 500 MG PO CAPS: ORAL_CAPSULE | Freq: Every day | ORAL | Status: DC

## 2019-12-27 MED ORDER — SODIUM CHLORIDE 0.9% FLUSH
3.0000 mL | Freq: Once | INTRAVENOUS | Status: AC
  Administered 2019-12-27: 16:00:00 3 mL via INTRAVENOUS

## 2019-12-27 MED ORDER — SIMVASTATIN 20 MG PO TABS
40.0000 mg | ORAL_TABLET | Freq: Every day | ORAL | Status: DC
  Administered 2019-12-27 – 2019-12-30 (×4): 40 mg via ORAL
  Filled 2019-12-27 (×4): qty 2

## 2019-12-27 MED ORDER — POLYETHYLENE GLYCOL 3350 17 G PO PACK: 17.0000 g | PACK | Freq: Every day | ORAL | Status: DC | PRN

## 2019-12-27 MED ORDER — CLONIDINE HCL 0.2 MG PO TABS
0.2000 mg | ORAL_TABLET | Freq: Two times a day (BID) | ORAL | Status: DC
  Administered 2019-12-27 – 2019-12-31 (×8): 0.2 mg via ORAL
  Filled 2019-12-27 (×6): qty 1
  Filled 2019-12-27: qty 2
  Filled 2019-12-27: qty 1

## 2019-12-27 MED ORDER — NIFEDIPINE ER OSMOTIC RELEASE 30 MG PO TB24
90.0000 mg | ORAL_TABLET | Freq: Every morning | ORAL | Status: DC
  Administered 2019-12-28 – 2019-12-31 (×4): 90 mg via ORAL
  Filled 2019-12-27 (×4): qty 3

## 2019-12-27 MED ORDER — VITAMIN D 25 MCG (1000 UNIT) PO TABS
2000.0000 [IU] | ORAL_TABLET | Freq: Every morning | ORAL | Status: DC
  Administered 2019-12-28 – 2019-12-31 (×4): 2000 [IU] via ORAL
  Filled 2019-12-27 (×4): qty 2

## 2019-12-27 MED ORDER — DEXAMETHASONE SODIUM PHOSPHATE 10 MG/ML IJ SOLN
6.0000 mg | Freq: Once | INTRAMUSCULAR | Status: AC
  Administered 2019-12-27: 16:00:00 6 mg via INTRAVENOUS
  Filled 2019-12-27: qty 1

## 2019-12-27 MED ORDER — ADULT MULTIVITAMIN W/MINERALS CH
1.0000 | ORAL_TABLET | Freq: Every day | ORAL | Status: DC
  Administered 2019-12-28 – 2019-12-31 (×4): 1 via ORAL
  Filled 2019-12-27 (×4): qty 1

## 2019-12-27 MED ORDER — SODIUM CHLORIDE 0.9 % IV SOLN
100.0000 mg | Freq: Every day | INTRAVENOUS | Status: AC
  Administered 2019-12-28 – 2019-12-31 (×4): 100 mg via INTRAVENOUS
  Filled 2019-12-27 (×3): qty 100
  Filled 2019-12-27 (×2): qty 20

## 2019-12-27 MED ORDER — ALBUMIN HUMAN 25 % IV SOLN
12.5000 g | Freq: Four times a day (QID) | INTRAVENOUS | Status: DC
  Administered 2019-12-27 – 2019-12-29 (×7): 12.5 g via INTRAVENOUS
  Filled 2019-12-27 (×9): qty 50

## 2019-12-27 MED ORDER — ACETAMINOPHEN 325 MG PO TABS
650.0000 mg | ORAL_TABLET | Freq: Four times a day (QID) | ORAL | Status: DC | PRN
  Administered 2019-12-27: 650 mg via ORAL
  Filled 2019-12-27: qty 2

## 2019-12-27 MED ORDER — HEPARIN SODIUM (PORCINE) 5000 UNIT/ML IJ SOLN
5000.0000 [IU] | Freq: Three times a day (TID) | INTRAMUSCULAR | Status: DC
  Administered 2019-12-27 – 2019-12-31 (×12): 5000 [IU] via SUBCUTANEOUS
  Filled 2019-12-27 (×12): qty 1

## 2019-12-27 NOTE — Telephone Encounter (Signed)
Discussed with patient and wife about Covid symptoms and the use of bamlanivimab, a monoclonal antibody infusion for those with mild to moderate Covid symptoms and at a high risk of hospitalization.  Pt is qualified for this infusion at the [facility] infusion center due to Age > 67   They would like to see primary first before making a decision about treatment.  Sx onset 1/9 and will schedule infusion tomorrow but call back later to discuss

## 2019-12-27 NOTE — ED Triage Notes (Signed)
Pt states COVID + on Monday.  Seen by PCP and scheduled for infusions at [facility] tomorrow but PCP called his daughter and said for him to come to ED for abnormal labs.  Pt unsure what labs were abnormal.  Reports fatigue and SOB.

## 2019-12-27 NOTE — ED Provider Notes (Signed)
Pt signed out by Dr. Sedonia Small pending callback from admission team.  Pt d/w Dr. Roosevelt Locks ([facility]) for admission.  Isaac Stewart was evaluated in Emergency Department on 12/27/2019 for the symptoms described in the history of present illness. He was evaluated in the context of the global COVID-19 pandemic, which necessitated consideration that the patient might be at risk for infection with the SARS-CoV-2 virus that causes COVID-19. Institutional protocols and algorithms that pertain to the evaluation of patients at risk for COVID-19 are in a state of rapid change based on information released by regulatory bodies including the CDC and federal and state organizations. These policies and algorithms were followed during the patient's care in the ED.   Isla Pence, MD 12/27/19 1640

## 2019-12-27 NOTE — ED Notes (Signed)
Noted pt spO2 85% on 2L [place], increased to 98% with 4L and repositioning (pt had scooted down in bed and was lying flat, encouraged to remain at [facility] elevated)

## 2019-12-27 NOTE — H&P (Signed)
History and Physical    Isaac Stewart DJM:[id number] DOB: March 28, 1941 DOA: 12/27/2019  PCP: Drosinis, Pamalee Leyden, PA-C   Patient coming from: home I have personally briefly reviewed patient's old medical records in [place]  Chief Complaint: Shortness of breath  HPI: Isaac Stewart is a 79 y.o. male with medical history significant of hypertension, hyperlipidemia, diabetes presenting to the ED with chief complaint of shortness of breath, fatigue, poor appetite.  Patient symptoms started 5 days ago with whole body aching generalized weakness and loss of smell.  Went to his PCP 3 days ago and diagnosed with Covid, has been having trouble getting around the house due to malaise, fatigue, not eating well, for the past 2 days with increased shortness of breath.  Denies chest pain, no abdominal pain, no vomiting, no diarrhea.  Symptoms moderate to severe, constant, no exacerbating or alleviating factors.    PCP planned to send patient to [facility] outpatient infusion center for multiple antibiotic treatment, but today patient went to urgent care for worsening of the symptoms and was sent here from urgent care for worsening of condition with AKI. ED Course: Was found to have hypoxia, but stabilized at 2 L.  Review of Systems: As per HPI otherwise 10 point review of systems negative.    Past Medical History:  Diagnosis Date  . Allergy   . 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 Diabetes ([place])    oral medications type 2  . HTN (hypertension)   . Hyperlipemia     Past Surgical History:  Procedure Laterality Date  . BLADDER SURGERY    . CATARACT EXTRACTION, BILATERAL    . HERNIA REPAIR    . INGUINAL HERNIA REPAIR Right 02/15/2019   Procedure: PRIMARY RIGHT INCARCERATED INGUINAL HERNIA REPAIR;  Surgeon: Leighton Ruff, MD;  Location: [facility] ORS;  Service: General;  Laterality: Right;  . PROSTATE SURGERY       reports that he quit smoking about 18 years ago. He has never  used smokeless tobacco. He reports current alcohol use. He reports that he does not use drugs.  No Known Allergies  Family History  Problem Relation Age of Onset  . Cancer Father   . Heart disease Maternal Grandfather   . Colon cancer Neg Hx   . Colon polyps Neg Hx   . Esophageal cancer Neg Hx   . Stomach cancer Neg Hx   . Rectal cancer Neg Hx      Prior to Admission medications   Medication Sig Start Date End Date Taking? Authorizing Provider  APPLE CIDER VINEGAR PO Take 2 tablets by mouth daily with breakfast.    Yes [provider]  aspirin EC 81 MG tablet Take 81 mg by mouth at bedtime.   Yes [provider]  CINNAMON PO Take 2 capsules by mouth daily.    Yes [provider]  cloNIDine (CATAPRES) 0.2 MG tablet Take 0.2 mg by mouth 2 (two) times daily.    Yes [provider]  diphenhydrAMINE (BENADRYL) 25 MG tablet Take 50 mg by mouth every morning.   Yes [provider]  fenofibrate 160 MG tablet Take 160 mg by mouth every morning.  07/10/14  Yes [provider]  glimepiride (AMARYL) 4 MG tablet Take 4 mg by mouth daily with breakfast.  07/10/14  Yes [provider]  MEGARED OMEGA-3 KRILL OIL PO Take 1 capsule by mouth daily after breakfast.   Yes [provider]  Multiple Vitamin (  MULTIVITAMIN) capsule Take 1 capsule by mouth daily. 01/11/11  Yes [provider]  NIFEdipine (PROCARDIA XL/NIFEDICAL-XL) 90 MG 24 hr tablet Take 90 mg by mouth every morning.  06/22/18  Yes [provider]  simvastatin (ZOCOR) 40 MG tablet Take 40 mg by mouth at bedtime.  03/14/14  Yes [provider]  TRESIBA FLEXTOUCH 200 UNIT/ML SOPN Inject 38-40 Units into the skin daily before breakfast.  12/18/19  Yes [provider]  Cholecalciferol (VITAMIN D3) 50 MCG (2000 UT) TABS Take 2,000 Units by mouth.    [provider]  glucose blood ([employer] TEST STRIPS) test strip 1 each by Other route 2 (two)  times daily. E11.9 10/06/17   [provider]  Insulin Pen Needle (FIFTY50 PEN NEEDLES) 32G X 4 MM MISC Use once daily 07/05/18   [provider]    Physical Exam: Vitals:   12/27/19 1530 12/27/19 1651 12/27/19 1700 12/27/19 1738  BP: (!) 151/64 (!) 149/70 128/75 (!) 137/53  Pulse: 96 92 94 98  Resp: (!) 26 (!) 22 (!) 23 (!) 25  Temp:    99.8 F (37.7 C)  TempSrc:    Oral  SpO2: 90% 95% 95% 92%    Constitutional: NAD, calm, comfortable Vitals:   12/27/19 1530 12/27/19 1651 12/27/19 1700 12/27/19 1738  BP: (!) 151/64 (!) 149/70 128/75 (!) 137/53  Pulse: 96 92 94 98  Resp: (!) 26 (!) 22 (!) 23 (!) 25  Temp:    99.8 F (37.7 C)  TempSrc:    Oral  SpO2: 90% 95% 95% 92%   Eyes: PERRL, lids and conjunctivae normal ENMT: Mucous membranes are moist. Posterior pharynx clear of any exudate or lesions.Normal dentition.  Neck: normal, supple, no masses, no thyromegaly Respiratory: clear to auscultation bilaterally, no wheezing, no crackles. Normal respiratory effort. No accessory muscle use.  Cardiovascular: Regular rate and rhythm, no murmurs / rubs / gallops. No extremity edema. 2+ pedal pulses. No carotid bruits.  Abdomen: no tenderness, no masses palpated. No hepatosplenomegaly. Bowel sounds positive.  Musculoskeletal: no clubbing / cyanosis. No joint deformity upper and lower extremities. Good ROM, no contractures. Normal muscle tone.  Skin: no rashes, lesions, ulcers. No induration Neurologic: CN 2-12 grossly intact. Sensation intact, DTR normal. Strength 5/5 in all 4.  Psychiatric: Normal judgment and insight. Alert and oriented x 3. Normal mood.     Labs on Admission: I have personally reviewed following labs and imaging studies  CBC: Recent Labs  Lab 12/27/19 1427  WBC 7.9  HGB 11.1*  HCT 35.1*  MCV 92.1  PLT 622   Basic Metabolic Panel: Recent Labs  Lab 12/27/19 1427  NA 137  K 4.4  CL 101  CO2 22  GLUCOSE 157*  BUN 39*  CREATININE 2.92*   CALCIUM 9.1   GFR: CrCl cannot be calculated (Unknown ideal weight.). Liver Function Tests: Recent Labs  Lab 12/27/19 1427  AST 31  ALT 25  ALKPHOS 55  BILITOT 0.8  PROT 6.7  ALBUMIN 3.1*   No results for input(s): LIPASE, AMYLASE in the last 168 hours. No results for input(s): AMMONIA in the last 168 hours. Coagulation Profile: No results for input(s): INR, PROTIME in the last 168 hours. Cardiac Enzymes: No results for input(s): CKTOTAL, CKMB, CKMBINDEX, TROPONINI in the last 168 hours. BNP (last 3 results) No results for input(s): PROBNP in the last 8760 hours. HbA1C: No results for input(s): HGBA1C in the last 72 hours. CBG: No results for input(s): GLUCAP in  the last 168 hours. Lipid Profile: Recent Labs    12/27/19 1427  TRIG 147   Thyroid Function Tests: No results for input(s): TSH, T4TOTAL, FREET4, T3FREE, THYROIDAB in the last 72 hours. Anemia Panel: Recent Labs    12/27/19 1427  FERRITIN 245   Urine analysis: No results found for: COLORURINE, APPEARANCEUR, LABSPEC, PHURINE, GLUCOSEU, HGBUR, BILIRUBINUR, KETONESUR, PROTEINUR, UROBILINOGEN, NITRITE, LEUKOCYTESUR  Radiological Exams on Admission: No results found.  EKG: Independently reviewed.   Assessment/Plan Active Problems:   COVID-19 virus infection   COVID-19  Acute hypoxic respite failure to COVID-19 infection, the patient on remdesivir and Decadron.  Check x-ray.  COVID-19 labs pending.  AKI on CKD stage III, albumin for hydration recheck BMP in the morning.   Hypertension, continue home meds.  Hyperlipidemia, on statin.  Diabetes, change p.o. diabetic meds to subcu sliding scale insulin    DVT prophylaxis: Heparin subcu Code Status: Full code Family Communication: Left patient family message over the phone disposition Plan: Likely home Consults called: None Admission status: Telemetry admission   Lequita Halt MD [facility] Pager [phone]  If 7PM-7AM, please contact  night-coverage [email or web address] Password [facility]  12/27/2019, 6:16 PM

## 2019-12-27 NOTE — ED Provider Notes (Signed)
[facility] Emergency Department Provider Note MRN:  [id number]  Arrival date & time: 12/27/19     Chief Complaint   COVID + and Abnormal Lab   History of Present Illness   Isaac Stewart is a 79 y.o. year-old male with a history of hypertension, hyperlipidemia, diabetes presenting to the ED with chief complaint of COVID-19.  Diagnosed with Covid last week, has been having trouble getting around the house due to malaise, fatigue, not eating well, for the past 2 days with increased shortness of breath.  Denies chest pain, no abdominal pain, no vomiting, no diarrhea.  Symptoms moderate to severe, constant, no exacerbating or alleviating factors.  Sent here from urgent care for worsening of condition with AKI.  Review of Systems  A complete 10 system review of systems was obtained and all systems are negative except as noted in the HPI and PMH.   Patient's Health History    Past Medical History:  Diagnosis Date  . Allergy   . 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 Diabetes ([place])    oral medications type 2  . HTN (hypertension)   . Hyperlipemia     Past Surgical History:  Procedure Laterality Date  . BLADDER SURGERY    . CATARACT EXTRACTION, BILATERAL    . HERNIA REPAIR    . INGUINAL HERNIA REPAIR Right 02/15/2019   Procedure: PRIMARY RIGHT INCARCERATED INGUINAL HERNIA REPAIR;  Surgeon: Leighton Ruff, MD;  Location: [facility];  Service: General;  Laterality: Right;  . PROSTATE SURGERY      Family History  Problem Relation Age of Onset  . Cancer Father   . Heart disease Maternal Grandfather   . Colon cancer Neg Hx   . Colon polyps Neg Hx   . Esophageal cancer Neg Hx   . Stomach cancer Neg Hx   . Rectal cancer Neg Hx     Social History   Socioeconomic History  . Marital status: Married    Spouse name: Not on file  . Number of children: Not on file  . Years of education: Not on file  . Highest education level: Not on file    Occupational History  . Not on file  Tobacco Use  . Smoking status: Former Smoker    Quit date: 08/06/2001    Years since quitting: 18.4  . Smokeless tobacco: Never Used  Substance and Sexual Activity  . Alcohol use: Yes    Comment: occ social  . Drug use: Never  . Sexual activity: Not on file  Other Topics Concern  . Not on file  Social History Narrative  . Not on file   Social Determinants of Health   Financial Resource Strain:   . Difficulty of Paying Living Expenses: Not on file  Food Insecurity:   . Worried About Charity fundraiser in the Last Year: Not on file  . Ran Out of Food in the Last Year: Not on file  Transportation Needs:   . Lack of Transportation (Medical): Not on file  . Lack of Transportation (Non-Medical): Not on file  Physical Activity:   . Days of Exercise per Week: Not on file  . Minutes of Exercise per Session: Not on file  Stress:   . Feeling of Stress : Not on file  Social Connections:   . Frequency of Communication with Friends and Family: Not on file  . Frequency of Social Gatherings with Friends and Family: Not on file  . Attends  Religious Services: Not on file  . Active Member of Clubs or Organizations: Not on file  . Attends Archivist Meetings: Not on file  . Marital Status: Not on file  Intimate Partner Violence:   . Fear of Current or Ex-Partner: Not on file  . Emotionally Abused: Not on file  . Physically Abused: Not on file  . Sexually Abused: Not on file     Physical Exam  Vital Signs and Nursing Notes reviewed Vitals:   12/27/19 1336 12/27/19 1530  BP: (!) 156/65 (!) 151/64  Pulse: 87 96  Resp: 18 (!) 26  Temp: 98.7 F (37.1 C)   SpO2: 96% 90%    CONSTITUTIONAL: Well-appearing, NAD NEURO:  Alert and oriented x 3, no focal deficits EYES:  eyes equal and reactive ENT/NECK:  no LAD, no JVD CARDIO: Regular rate, well-perfused, normal S1 and S2 PULM:  CTAB no wheezing or rhonchi, tachypneic GI/GU:  normal  bowel sounds, non-distended, non-tender MSK/SPINE:  No gross deformities, no edema SKIN:  no rash, atraumatic PSYCH:  Appropriate speech and behavior  Diagnostic and Interventional Summary    EKG Interpretation  Date/Time:  Thursday December 27 2019 13:55:16 EST Ventricular Rate:  101 PR Interval:  138 QRS Duration: 88 QT Interval:  330 QTC Calculation: 427 R Axis:   10 Text Interpretation: Sinus tachycardia Otherwise normal ECG Confirmed by Gerlene Fee [phone]) on 12/27/2019 3:08:53 PM      Labs Reviewed  CBC - Abnormal; Notable for the following components:      Result Value   RBC 3.81 (*)    Hemoglobin 11.1 (*)    HCT 35.1 (*)    All other components within normal limits  COMPREHENSIVE METABOLIC PANEL - Abnormal; Notable for the following components:   Glucose, Bld 157 (*)    BUN 39 (*)    Creatinine, Ser 2.92 (*)    Albumin 3.1 (*)    GFR calc non Af Amer 20 (*)    GFR calc Af Amer 23 (*)    All other components within normal limits  D-DIMER, QUANTITATIVE (NOT AT [facility]) - Abnormal; Notable for the following components:   D-Dimer, Quant 1.66 (*)    All other components within normal limits  LACTATE DEHYDROGENASE - Abnormal; Notable for the following components:   LDH 203 (*)    All other components within normal limits  FIBRINOGEN - Abnormal; Notable for the following components:   Fibrinogen 695 (*)    All other components within normal limits  C-REACTIVE PROTEIN - Abnormal; Notable for the following components:   CRP 27.6 (*)    All other components within normal limits  LACTIC ACID, PLASMA  PROCALCITONIN  FERRITIN  LACTIC ACID, PLASMA  TRIGLYCERIDES  POC SARS CORONAVIRUS 2 AG -  ED    No orders to display    Medications  sodium chloride 0.9 % bolus 500 mL (has no administration in time range)  sodium chloride flush (NS) 0.9 % injection 3 mL (3 mLs Intravenous Given 12/27/19 1544)  dexamethasone (DECADRON) injection 6 mg (6 mg Intravenous Given 12/27/19  1542)     Procedures  /  Critical Care Procedures  ED Course and Medical Decision Making  I have reviewed the triage vital signs, the nursing notes, and pertinent available records from the EMR.  Pertinent labs & imaging results that were available during my care of the patient were reviewed by me and considered in my medical decision making (see below for details).  Positive coronavirus test but not our system, will confirm.  On my evaluation patient is with mild hypoxia at 88% on room air, started on 2 L nasal cannula, will provide Decadron, anticipating admission.  Labs confirm AKI, providing Decadron, judicious fluids, will admit to unassigned internal medicine.  Keyshon Stein was evaluated in Emergency Department on 12/27/2019 for the symptoms described in the history of present illness. He was evaluated in the context of the global COVID-19 pandemic, which necessitated consideration that the patient might be at risk for infection with the SARS-CoV-2 virus that causes COVID-19. Institutional protocols and algorithms that pertain to the evaluation of patients at risk for COVID-19 are in a state of rapid change based on information released by regulatory bodies including the CDC and federal and state organizations. These policies and algorithms were followed during the patient's care in the ED.   Barth Kirks. Sedonia Small, MD [place] [email or web address]  Final Clinical Impressions(s) / ED Diagnoses     ICD-10-CM   1. COVID-19  U07.1   2. AKI (acute kidney injury) ([place])  N17.9     ED Discharge Orders    None       Discharge Instructions Discussed with and Provided to Patient:   Discharge Instructions   None       Maudie Flakes, MD 12/27/19 1609

## 2019-12-27 NOTE — ED Notes (Signed)
Declined to have anyone contacted at this time

## 2019-12-27 NOTE — ED Notes (Signed)
Pt desats to 87% with 4L [place]. Noted pt mouth breathing and slid down in bed. Repositioned, applied face mask without O2 over [place] to help retain O2 provide by [place], sats now 94%

## 2019-12-27 NOTE — ED Notes (Signed)
Lunch Tray Ordered @ [id number].

## 2019-12-28 DIAGNOSIS — J9601 Acute respiratory failure with hypoxia: Secondary | ICD-10-CM

## 2019-12-28 LAB — CBC WITH DIFFERENTIAL/PLATELET
Abs Immature Granulocytes: 0.02 10*3/uL (ref 0.00–0.07)
Basophils Absolute: 0 10*3/uL (ref 0.0–0.1)
Basophils Relative: 0 %
Eosinophils Absolute: 0 10*3/uL (ref 0.0–0.5)
Eosinophils Relative: 0 %
HCT: 29.6 % — ABNORMAL LOW (ref 39.0–52.0)
Hemoglobin: 9.9 g/dL — ABNORMAL LOW (ref 13.0–17.0)
Immature Granulocytes: 0 %
Lymphocytes Relative: 14 %
Lymphs Abs: 0.7 10*3/uL (ref 0.7–4.0)
MCH: 29.4 pg (ref 26.0–34.0)
MCHC: 33.4 g/dL (ref 30.0–36.0)
MCV: 87.8 fL (ref 80.0–100.0)
Monocytes Absolute: 0.1 10*3/uL (ref 0.1–1.0)
Monocytes Relative: 3 %
Neutro Abs: 3.8 10*3/uL (ref 1.7–7.7)
Neutrophils Relative %: 83 %
Platelets: 214 10*3/uL (ref 150–400)
RBC: 3.37 MIL/uL — ABNORMAL LOW (ref 4.22–5.81)
RDW: 13.5 % (ref 11.5–15.5)
WBC: 4.7 10*3/uL (ref 4.0–10.5)
nRBC: 0 % (ref 0.0–0.2)

## 2019-12-28 LAB — COMPREHENSIVE METABOLIC PANEL
ALT: 22 U/L (ref 0–44)
AST: 25 U/L (ref 15–41)
Albumin: 2.8 g/dL — ABNORMAL LOW (ref 3.5–5.0)
Alkaline Phosphatase: 45 U/L (ref 38–126)
Anion gap: 14 (ref 5–15)
BUN: 45 mg/dL — ABNORMAL HIGH (ref 8–23)
CO2: 20 mmol/L — ABNORMAL LOW (ref 22–32)
Calcium: 8.5 mg/dL — ABNORMAL LOW (ref 8.9–10.3)
Chloride: 104 mmol/L (ref 98–111)
Creatinine, Ser: 2.73 mg/dL — ABNORMAL HIGH (ref 0.61–1.24)
GFR calc Af Amer: 25 mL/min — ABNORMAL LOW (ref >60)
GFR calc non Af Amer: 21 mL/min — ABNORMAL LOW (ref >60)
Glucose, Bld: 212 mg/dL — ABNORMAL HIGH (ref 70–99)
Potassium: 4.4 mmol/L (ref 3.5–5.1)
Sodium: 138 mmol/L (ref 135–145)
Total Bilirubin: 0.6 mg/dL (ref 0.3–1.2)
Total Protein: 5.8 g/dL — ABNORMAL LOW (ref 6.5–8.1)

## 2019-12-28 LAB — FERRITIN: Ferritin: 197 ng/mL (ref 24–336)

## 2019-12-28 LAB — C-REACTIVE PROTEIN: CRP: 24.4 mg/dL — ABNORMAL HIGH (ref <1.0)

## 2019-12-28 LAB — GLUCOSE, CAPILLARY
Glucose-Capillary: 188 mg/dL — ABNORMAL HIGH (ref 70–99)
Glucose-Capillary: 166 mg/dL — ABNORMAL HIGH (ref 70–99)
Glucose-Capillary: 210 mg/dL — ABNORMAL HIGH (ref 70–99)
Glucose-Capillary: 231 mg/dL — ABNORMAL HIGH (ref 70–99)
Glucose-Capillary: 347 mg/dL — ABNORMAL HIGH (ref 70–99)

## 2019-12-28 LAB — D-DIMER, QUANTITATIVE: D-Dimer, Quant: 1.51 ug/mL-FEU — ABNORMAL HIGH (ref 0.00–0.50)

## 2019-12-28 LAB — PROCALCITONIN: Procalcitonin: 0.39 ng/mL

## 2019-12-28 LAB — MAGNESIUM: Magnesium: 2.1 mg/dL (ref 1.7–2.4)

## 2019-12-28 LAB — PHOSPHORUS: Phosphorus: 3.9 mg/dL (ref 2.5–4.6)

## 2019-12-28 MED ORDER — HYDROCOD POLST-CPM POLST ER 10-8 MG/5ML PO SUER: 5.0000 mL | Freq: Every evening | ORAL | Status: DC | PRN

## 2019-12-28 MED ORDER — INSULIN DETEMIR 100 UNIT/ML ~~LOC~~ SOLN
8.0000 [IU] | Freq: Two times a day (BID) | SUBCUTANEOUS | Status: DC
  Administered 2019-12-28 – 2019-12-31 (×6): 8 [IU] via SUBCUTANEOUS
  Filled 2019-12-28 (×9): qty 0.08

## 2019-12-28 MED ORDER — ALBUTEROL SULFATE HFA 108 (90 BASE) MCG/ACT IN AERS
1.0000 | INHALATION_SPRAY | RESPIRATORY_TRACT | Status: DC | PRN
  Administered 2019-12-28: 2 via RESPIRATORY_TRACT
  Filled 2019-12-28: qty 6.7

## 2019-12-28 MED ORDER — ZINC SULFATE 220 (50 ZN) MG PO CAPS
220.0000 mg | ORAL_CAPSULE | Freq: Every day | ORAL | Status: DC
  Administered 2019-12-28 – 2019-12-31 (×4): 220 mg via ORAL
  Filled 2019-12-28 (×4): qty 1

## 2019-12-28 MED ORDER — IPRATROPIUM-ALBUTEROL 20-100 MCG/ACT IN AERS
1.0000 | INHALATION_SPRAY | Freq: Four times a day (QID) | RESPIRATORY_TRACT | Status: DC
  Administered 2019-12-28 – 2019-12-31 (×14): 1 via RESPIRATORY_TRACT
  Filled 2019-12-28 (×2): qty 4

## 2019-12-28 MED ORDER — GUAIFENESIN-DM 100-10 MG/5ML PO SYRP: 10.0000 mL | ORAL_SOLUTION | ORAL | Status: DC | PRN

## 2019-12-28 MED ORDER — ASCORBIC ACID 500 MG PO TABS
500.0000 mg | ORAL_TABLET | Freq: Every day | ORAL | Status: DC
  Administered 2019-12-28 – 2019-12-31 (×4): 500 mg via ORAL
  Filled 2019-12-28 (×4): qty 1

## 2019-12-28 MED ORDER — DEXAMETHASONE SODIUM PHOSPHATE 10 MG/ML IJ SOLN
6.0000 mg | INTRAMUSCULAR | Status: DC
  Administered 2019-12-28 – 2019-12-31 (×4): 6 mg via INTRAVENOUS
  Filled 2019-12-28 (×4): qty 1

## 2019-12-28 NOTE — Progress Notes (Signed)
Inpatient Diabetes Program Recommendations  AACE/ADA: New Consensus Statement on Inpatient Glycemic Control (2015)  Target Ranges:  Prepandial:   less than 140 mg/dL      Peak postprandial:   less than 180 mg/dL (1-2 hours)      Critically ill patients:  140 - 180 mg/dL   Lab Results  Component Value Date   GLUCAP 166 (H) 12/28/2019   HGBA1C 8.2 (H) 12/27/2019    Review of Glycemic Control Results for Isaac Stewart, Isaac Stewart (MRN [id number]) as of 12/28/2019 15:27  Ref. Range 12/28/2019 00:04 12/28/2019 07:49 12/28/2019 11:19  Glucose-Capillary Latest Ref Range: 70 - 99 mg/dL 210 (H) 188 (H) 166 (H)   Diabetes history: Type 2 DM Outpatient Diabetes medications: Amaryl 4 mg QAM, Tresiba 38-40 units QAM Current orders for Inpatient glycemic control: Novolog 0-15 units TID; Decadron 6 mg QD  Inpatient Diabetes Program Recommendations:    In the event glucose trends continue to increase >200's mg/dL, in the setting of steroids, consider adding Levemir 8 units BID.   Thanks, Bronson Curb, MSN, RNC-OB Diabetes Coordinator [phone] (8a-5p)

## 2019-12-28 NOTE — Evaluation (Signed)
Physical Therapy Evaluation Patient Details Name: Isaac Stewart MRN: [id number] DOB: [birth year] Today's Date: 12/28/2019   History of Present Illness  Isaac Stewart is a 79 y.o. male with medical history significant of hypertension, hyperlipidemia, diabetes presenting to the ED with chief complaint of shortness of breath, fatigue, poor appetite.  Patient symptoms started 5 days ago with whole body aching generalized weakness and loss of smell.  Went to his PCP 3 days ago and diagnosed with Covid, has been having trouble getting around the house due to malaise, fatigue, not eating well, for the past 2 days with increased shortness of breath.  Denies chest pain, no abdominal pain, no vomiting, no diarrhea.  Symptoms moderate to severe, constant, no exacerbating or alleviating factors.    PCP planned to send patient to [facility] outpatient infusion center for multiple antibiotic treatment, but today patient went to urgent care for worsening of the symptoms and was sent here from urgent care for worsening of condition with AKI.  Clinical Impression  Pt admitted with above diagnosis. Pt was able to ambulate in room doing laps without device with supervision. No issues noted with balance. Sats >92% on RA as well. Did place 2L O2 back on pt after walk due to the fact that he was on 2L on arrival. Feel that this pt will progress well.  Pt currently with functional limitations due to the deficits listed below (see PT Problem List). Pt will benefit from skilled PT to increase their independence and safety with mobility to allow discharge to the venue listed below.      Follow Up Recommendations No PT follow up;Supervision - Intermittent    Equipment Recommendations  None recommended by PT    Recommendations for Other Services       Precautions / Restrictions Precautions Precautions: Fall Restrictions Weight Bearing Restrictions: No      Mobility  Bed Mobility Overal bed mobility: Independent                Transfers Overall transfer level: Independent                  Ambulation/Gait Ambulation/Gait assistance: Supervision Gait Distance (Feet): 125 Feet Assistive device: None Gait Pattern/deviations: Step-through pattern;Decreased stride length   Gait velocity interpretation: 1.31 - 2.62 ft/sec, indicative of limited community ambulator General Gait Details: Pt was on 2LO2 on arrival.  Pt was able to ambulate with supervision in room doing laps.  Sats were 92% and > with ambulation on RA.  Replaced 2LO2 as it was on on arrival.   Stairs            Wheelchair Mobility    Modified Rankin (Stroke Patients Only)       Balance Overall balance assessment: Needs assistance Sitting-balance support: No upper extremity supported;Feet supported Sitting balance-Leahy Scale: Fair     Standing balance support: No upper extremity supported;During functional activity Standing balance-Leahy Scale: Good Standing balance comment: Pt with good static and dynamic standing balance.              High level balance activites: Direction changes;Turns;Sudden stops;Backward walking High Level Balance Comments: Supervision for ambulation with challenges             Pertinent Vitals/Pain Pain Assessment: No/denies pain    Home Living Family/patient expects to be discharged to:: Private residence Living Arrangements: Spouse/significant other Available Help at Discharge: Family;Available 24 hours/day Type of Home: House Home Access: Stairs to enter Entrance Stairs-Rails: None Entrance Stairs-Number of  Steps: 1 Home Layout: Multi-level Home Equipment: None      Prior Function Level of Independence: Independent               Hand Dominance   Dominant Hand: Right    Extremity/Trunk Assessment   Upper Extremity Assessment Upper Extremity Assessment: Defer to OT evaluation    Lower Extremity Assessment Lower Extremity Assessment: Overall WFL  for tasks assessed    Cervical / Trunk Assessment Cervical / Trunk Assessment: Normal  Communication   Communication: HOH  Cognition Arousal/Alertness: Awake/alert Behavior During Therapy: WFL for tasks assessed/performed Overall Cognitive Status: Within Functional Limits for tasks assessed                                        General Comments      Exercises General Exercises - Lower Extremity Hip Flexion/Marching: AROM;15 reps;Standing Mini-Sqauts: 15 reps;AROM;Standing   Assessment/Plan    PT Assessment Patient needs continued PT services  PT Problem List Decreased activity tolerance;Decreased balance;Decreased mobility;Decreased knowledge of use of DME;Decreased safety awareness;Decreased knowledge of precautions;Cardiopulmonary status limiting activity       PT Treatment Interventions DME instruction;Gait training;Functional mobility training;Therapeutic activities;Therapeutic exercise;Balance training;Patient/family education;Stair training    PT Goals (Current goals can be found in the Care Plan section)  Acute Rehab PT Goals Patient Stated Goal: to go home PT Goal Formulation: With patient Time For Goal Achievement: 01/11/20 Potential to Achieve Goals: Good    Frequency Min 3X/week   Barriers to discharge        Co-evaluation               AM-PAC PT "6 Clicks" Mobility  Outcome Measure Help needed turning from your back to your side while in a flat bed without using bedrails?: None Help needed moving from lying on your back to sitting on the side of a flat bed without using bedrails?: None Help needed moving to and from a bed to a chair (including a wheelchair)?: None Help needed standing up from a chair using your arms (e.g., wheelchair or bedside chair)?: None Help needed to walk in hospital room?: None Help needed climbing 3-5 steps with a railing? : None 6 Click Score: 24    End of Session Equipment Utilized During Treatment:  Gait belt;Oxygen Activity Tolerance: Patient limited by fatigue Patient left: in chair;with call bell/phone within reach Nurse Communication: Mobility status PT Visit Diagnosis: Muscle weakness (generalized) (M62.81)    Time: 5400-8676 PT Time Calculation (min) (ACUTE ONLY): 21 min   Charges:   PT Evaluation $PT Eval Moderate Complexity: 1 Mod          Giuseppe Duchemin W,PT Acute Rehabilitation Services Pager:  [phone]  Office:  [place] 12/28/2019, 11:17 AM

## 2019-12-28 NOTE — Progress Notes (Signed)
Received to room 2W22 from ER via stretcher. Assisted to bed and positioned for comfort. Oriented to room, bed and unit. In no acute distress.

## 2019-12-28 NOTE — Progress Notes (Signed)
No acute issues today. Patient reported BM today. Education provided regarding incentive spirometry and flutter valve therapy. Noticed compliance with therapy frequency. Patient up ad lib. He is reminded to prevent right arm flexion during IV therapy.

## 2019-12-28 NOTE — Telephone Encounter (Signed)
Called to check how he is doing.  He is improved and able to eat and drink.  However would like prednisone.  Referred to primary

## 2019-12-28 NOTE — Progress Notes (Signed)
PROGRESS NOTE    Isaac Stewart  LAG:[id number] DOB: 05/26/1941 DOA: 12/27/2019 PCP: Mina Marble, PA-C   Brief Narrative:  HPI per Dr. Wynetta Fines on 12/26/2018  Isaac Stewart is a 79 y.o. male with medical history significant of hypertension, hyperlipidemia, diabetespresenting to the ED with chief complaint of shortness of breath, fatigue, poor appetite.  Patient symptoms started 5 days ago with whole body aching generalized weakness and loss of smell.  Went to his PCP 3 days ago and diagnosed with Covid, has been having trouble getting around the house due to malaise, fatigue, not eating well, for the past 2 days with increased shortness of breath. Denies chest pain, no abdominal pain, no vomiting, no diarrhea. Symptoms moderate to severe, constant, no exacerbating or alleviating factors.   PCP planned to send patient to [facility] outpatient infusion center for multiple antibiotic treatment, but today patient went to urgent care for worsening of the symptoms and was sent here from urgent care for worsening of condition with AKI. ED Course: Was found to have hypoxia, but stabilized at 2 L.  **Interim History  Patient states that he is doing okay and feels a little bit better however still on supplemental oxygen via nasal cannula.  Had a bowel movement this morning.  Started on remdesivir and steroids which will be continued.  Inflammatory markers slightly trending down.  We will repeat chest x-ray in the a.m. and have added inhaler therapy for the patient.  Assessment & Plan:   Active Problems:   COVID-19 virus infection   COVID-19  Pneumonia 2/2 to COVID-19 Virus Acute Respiratory Failure with Hypoxia in the setting of COVID-19 -Admitted to Inpatient Medical Telemetry  -Was febrile over night with a TMAx of 100.8; he is hemodynamically stable but does not complain of SOF now  -Initial CXR showed "There is shallow inspiration with minimal bibasilar atelectasis. Bilateral  interstitial and streaky densities primarily involving the right upper lung field most consistent with multifocal pneumonia, likely viral or atypical in etiology. Clinical correlation and follow-up recommended. No lobar consolidation, pleural effusion or pneumothorax. Stable cardiomediastinal silhouette. Atherosclerotic calcification of the aorta. No acute osseous pathology" -Checked Baseline Inflammatory Markers and Trend Daily: Recent Labs    12/27/19 1427 12/28/19 0337  DDIMER 1.66* 1.51*  FERRITIN 245 197  LDH 203*  --   CRP 27.6* 24.4*  -Initial PCT was 0.31 -> 0.39 -Fibrinogen was 695  No results found for: SARSCOV2NAA  -SpO2: 96 % O2 Flow Rate (L/min): 3 L/min  -Started Treatment with 5 Days of Remdesivir and 10 Days of IV Decadron; Day 2/5 of Remdesivir and Day 2/10 of Steroids (6 mg of Dexamethasone) -May try Actemra and or Convalescent Plasma if not improving  -C/w Antitussives with Guaifenesin-Dextromethorphan 10 mL po q4hprn Cough and Chlorpheniramine-Hydrocodone 5 mL po q12hprn -Check Blood Cx x2; ordered now -Prone as much as possible  -C/w Acetaminophen for Fever  -C/w Zinc 220 mg po Daily and Vitamin C  -Airborne and Contact Precautions -C/w Combivent Scheduled 1 puff IH q6h and Albuterol 1-2 puff IH q4hprn -Add Flutter Valve and Incentive Spirometry  -Continue to monitor and trend inflammatory markers and repeat chest x-ray imaging in the a.m.  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  HTN -C/w Nifedipine 90 mg po qMorning   HLD -C/w Fenofibrate 160 mg po Daily and Simvastatin 40 mg po qHS  Diabetes  Mellitus Type 2, Poorly controlled -Patient's HbA1c was 8.2 -C/w Moderate Novolog SSI AC -Likely to be further elevated in the setting of Steroid Demargination -CBG ranging from 166-210  Normocytic Anemia/Anemia of Chronic Kidney Disease -Patient's Hb/Hct went from 11.1/35.1 -> 9.9/29.6 -Check Anemia Panel in the AM  -Continue to Monitor for S/Sx of Bleeding -Repeat CBC in AM   Overweight -Estimated body mass index is 30.26 kg/m as calculated from the following:   Height as of this encounter: 5\' 11"  (1.803 m).   Weight as of this encounter: 98.4 kg.  DVT prophylaxis: Heparin 5,000 units sq q8h Code Status: FULL CODE Family Communication: No family present at bedside  Disposition Plan: Pending further clinical improvement and Improvement in respiratory Status along with Inflammatory markers  Consultants:   None   Procedures: None  Antimicrobials: Anti-infectives (From admission, onward)   Start     Dose/Rate Route Frequency Ordered Stop   12/28/19 1000  remdesivir 100 mg in sodium chloride 0.9 % 100 mL IVPB     100 mg 200 mL/hr over 30 Minutes Intravenous Daily 12/27/19 1724 01/01/20 0959   12/27/19 1730  remdesivir 200 mg in sodium chloride 0.9% 250 mL IVPB     200 mg 580 mL/hr over 30 Minutes Intravenous Once 12/27/19 1724 12/27/19 2003     Subjective: Seen and examined at bedside and states that he is doing okay and feels a little bit better.  States that he is having a nonproductive cough.  No chest pain, lightheadedness or dizziness.  Had a bowel movement early this morning.  No other concerns or complaints this time and feels a little bit better today than yesterday.  Objective: Vitals:   12/27/19 2230 12/27/19 2257 12/28/19 0743 12/28/19 1300  BP: (!) 105/53  (!) 121/42   Pulse: 87  95   Resp: 18     Temp:  99.1 F (37.3 C) 98.7 F (37.1 C)   TempSrc:   Oral   SpO2: 93% 95% 96%   Weight:    98.4 kg  Height:    5\' 11"  (1.803 m)    Intake/Output Summary  (Last 24 hours) at 12/28/2019 1317 Last data filed at 12/28/2019 0830 Gross per 24 hour  Intake 875 ml  Output --  Net 875 ml   Filed Weights   12/28/19 1300  Weight: 98.4 kg   Examination: Physical Exam:  Constitutional: WN/WD obese Caucasian male currently in NAD and appears calm  Eyes: Lids and conjunctivae normal, sclerae anicteric  ENMT: External Ears, Nose appear normal. Grossly normal hearing. Neck: Appears normal, supple, no cervical masses, normal ROM, no appreciable thyromegaly; no JVD Respiratory: Diminished to auscultation bilaterally with coarse breath sounds, no wheezing, rales, rhonchi or crackles. Normal respiratory effort and patient is not tachypenic. No accessory muscle use. Wearing Supplemental O2 via [place]  Cardiovascular: RRR, no murmurs / rubs / gallops. S1 and S2 auscultated. Trace extremity edema. Abdomen: Soft, non-tender, Distended 2/2 body habitus. Bowel sounds positive x4.  GU: Deferred. Musculoskeletal: No clubbing / cyanosis of digits/nails. No joint deformity upper and lower extremities.  Skin: No rashes, lesions, ulcers on a limited skin evaluation. No induration; Warm and dry.  Neurologic: CN 2-12 grossly intact with no focal deficits. Romberg  sign and cerebellar reflexes not assessed.  Psychiatric: Normal judgment and insight. Alert and oriented x 3. Normal mood and appropriate affect.   Data Reviewed: I have personally reviewed following labs and imaging studies  CBC: Recent Labs  Lab 12/27/19 1427 12/28/19 0337  WBC 7.9 4.7  NEUTROABS  --  3.8  HGB 11.1* 9.9*  HCT 35.1* 29.6*  MCV 92.1 87.8  PLT 231 382   Basic Metabolic Panel: Recent Labs  Lab 12/27/19 1427 12/28/19 0337  NA 137 138  K 4.4 4.4  CL 101 104  CO2 22 20*  GLUCOSE 157* 212*  BUN 39* 45*  CREATININE 2.92* 2.73*  CALCIUM 9.1 8.5*  MG  --  2.1  PHOS  --  3.9   GFR: Estimated Creatinine Clearance: 27.1 mL/min (A) (by C-G formula based on SCr of 2.73 mg/dL (H)). Liver  Function Tests: Recent Labs  Lab 12/27/19 1427 12/28/19 0337  AST 31 25  ALT 25 22  ALKPHOS 55 45  BILITOT 0.8 0.6  PROT 6.7 5.8*  ALBUMIN 3.1* 2.8*   No results for input(s): LIPASE, AMYLASE in the last 168 hours. No results for input(s): AMMONIA in the last 168 hours. Coagulation Profile: No results for input(s): INR, PROTIME in the last 168 hours. Cardiac Enzymes: No results for input(s): CKTOTAL, CKMB, CKMBINDEX, TROPONINI in the last 168 hours. BNP (last 3 results) No results for input(s): PROBNP in the last 8760 hours. HbA1C: Recent Labs    12/27/19 2011  HGBA1C 8.2*   CBG: Recent Labs  Lab 12/28/19 0004 12/28/19 0749 12/28/19 1119  GLUCAP 210* 188* 166*   Lipid Profile: Recent Labs    12/27/19 1427  TRIG 147   Thyroid Function Tests: No results for input(s): TSH, T4TOTAL, FREET4, T3FREE, THYROIDAB in the last 72 hours. Anemia Panel: Recent Labs    12/27/19 1427 12/28/19 0337  FERRITIN 245 197   Sepsis Labs: Recent Labs  Lab 12/27/19 1427 12/28/19 0939  PROCALCITON 0.31 0.39  LATICACIDVEN 1.6  --    Radiology Studies: DG Chest Port 1 View  Result Date: 12/27/2019 CLINICAL DATA:  79 year old male with COVID-19. EXAM: PORTABLE CHEST 1 VIEW COMPARISON:  Chest radiograph dated 12/27/2019. FINDINGS: There is shallow inspiration with minimal bibasilar atelectasis. Bilateral interstitial and streaky densities primarily involving the right upper lung field most consistent with multifocal pneumonia, likely viral or atypical in etiology. Clinical correlation and follow-up recommended. No lobar consolidation, pleural effusion or pneumothorax. Stable cardiomediastinal silhouette. Atherosclerotic calcification of the aorta. No acute osseous pathology. IMPRESSION: Pneumonia.  Clinical correlation and follow-up recommended. Electronically Signed   By: Anner Crete M.D.   On: 12/27/2019 19:30   Scheduled Meds: . ascorbic acid  500 mg Oral Daily  . aspirin EC   81 mg Oral QHS  . cholecalciferol  2,000 Units Oral q morning - 10a  . cloNIDine  0.2 mg Oral BID  . dexamethasone (DECADRON) injection  6 mg Intravenous Q24H  . fenofibrate  160 mg Oral q morning - 10a  . heparin  5,000 Units Subcutaneous Q8H  . insulin aspart  0-15 Units Subcutaneous TID WC  . Ipratropium-Albuterol  1 puff Inhalation Q6H  . multivitamin with minerals  1 tablet Oral Daily  . NIFEdipine  90 mg Oral q morning - 10a  . simvastatin  40 mg Oral QHS  . zinc sulfate  220 mg Oral Daily   Continuous Infusions: . albumin human 12.5 g (12/28/19 0606)  . remdesivir 100 mg in NS  100 mL 100 mg (12/28/19 1244)    LOS: 1 day   Kerney Elbe, DO [facility] PAGER is on [place]  If 7PM-7AM, please contact night-coverage [email or web address]

## 2019-12-29 ENCOUNTER — Ambulatory Visit (HOSPITAL_COMMUNITY): Payer: HMO

## 2019-12-29 ENCOUNTER — Inpatient Hospital Stay (HOSPITAL_COMMUNITY): Payer: HMO

## 2019-12-29 LAB — COMPREHENSIVE METABOLIC PANEL
ALT: 26 U/L (ref 0–44)
AST: 28 U/L (ref 15–41)
Albumin: 3.2 g/dL — ABNORMAL LOW (ref 3.5–5.0)
Alkaline Phosphatase: 46 U/L (ref 38–126)
Anion gap: 10 (ref 5–15)
BUN: 53 mg/dL — ABNORMAL HIGH (ref 8–23)
CO2: 22 mmol/L (ref 22–32)
Calcium: 8.9 mg/dL (ref 8.9–10.3)
Chloride: 108 mmol/L (ref 98–111)
Creatinine, Ser: 2.42 mg/dL — ABNORMAL HIGH (ref 0.61–1.24)
GFR calc Af Amer: 29 mL/min — ABNORMAL LOW (ref >60)
GFR calc non Af Amer: 25 mL/min — ABNORMAL LOW (ref >60)
Glucose, Bld: 161 mg/dL — ABNORMAL HIGH (ref 70–99)
Potassium: 4.7 mmol/L (ref 3.5–5.1)
Sodium: 140 mmol/L (ref 135–145)
Total Bilirubin: 0.4 mg/dL (ref 0.3–1.2)
Total Protein: 6.5 g/dL (ref 6.5–8.1)

## 2019-12-29 LAB — CBC WITH DIFFERENTIAL/PLATELET
Abs Immature Granulocytes: 0.02 10*3/uL (ref 0.00–0.07)
Basophils Absolute: 0 10*3/uL (ref 0.0–0.1)
Basophils Relative: 0 %
Eosinophils Absolute: 0 10*3/uL (ref 0.0–0.5)
Eosinophils Relative: 0 %
HCT: 32 % — ABNORMAL LOW (ref 39.0–52.0)
Hemoglobin: 10.4 g/dL — ABNORMAL LOW (ref 13.0–17.0)
Immature Granulocytes: 0 %
Lymphocytes Relative: 18 %
Lymphs Abs: 1 10*3/uL (ref 0.7–4.0)
MCH: 28.7 pg (ref 26.0–34.0)
MCHC: 32.5 g/dL (ref 30.0–36.0)
MCV: 88.4 fL (ref 80.0–100.0)
Monocytes Absolute: 0.3 10*3/uL (ref 0.1–1.0)
Monocytes Relative: 5 %
Neutro Abs: 4.1 10*3/uL (ref 1.7–7.7)
Neutrophils Relative %: 77 %
Platelets: 297 10*3/uL (ref 150–400)
RBC: 3.62 MIL/uL — ABNORMAL LOW (ref 4.22–5.81)
RDW: 13.5 % (ref 11.5–15.5)
WBC: 5.4 10*3/uL (ref 4.0–10.5)
nRBC: 0 % (ref 0.0–0.2)

## 2019-12-29 LAB — C-REACTIVE PROTEIN: CRP: 14.4 mg/dL — ABNORMAL HIGH (ref <1.0)

## 2019-12-29 LAB — GLUCOSE, CAPILLARY
Glucose-Capillary: 141 mg/dL — ABNORMAL HIGH (ref 70–99)
Glucose-Capillary: 186 mg/dL — ABNORMAL HIGH (ref 70–99)
Glucose-Capillary: 176 mg/dL — ABNORMAL HIGH (ref 70–99)
Glucose-Capillary: 231 mg/dL — ABNORMAL HIGH (ref 70–99)
Glucose-Capillary: 236 mg/dL — ABNORMAL HIGH (ref 70–99)

## 2019-12-29 LAB — FIBRINOGEN: Fibrinogen: 610 mg/dL — ABNORMAL HIGH (ref 210–475)

## 2019-12-29 LAB — RETICULOCYTES
Immature Retic Fract: 9.5 % (ref 2.3–15.9)
RBC.: 3.58 MIL/uL — ABNORMAL LOW (ref 4.22–5.81)
Retic Count, Absolute: 22.2 10*3/uL (ref 19.0–186.0)
Retic Ct Pct: 0.6 % (ref 0.4–3.1)

## 2019-12-29 LAB — LACTATE DEHYDROGENASE: LDH: 198 U/L — ABNORMAL HIGH (ref 98–192)

## 2019-12-29 LAB — PHOSPHORUS: Phosphorus: 3 mg/dL (ref 2.5–4.6)

## 2019-12-29 LAB — MAGNESIUM: Magnesium: 2.1 mg/dL (ref 1.7–2.4)

## 2019-12-29 LAB — D-DIMER, QUANTITATIVE: D-Dimer, Quant: 1.53 ug/mL-FEU — ABNORMAL HIGH (ref 0.00–0.50)

## 2019-12-29 LAB — SEDIMENTATION RATE: Sed Rate: 55 mm/hr — ABNORMAL HIGH (ref 0–16)

## 2019-12-29 LAB — IRON AND TIBC
Iron: 41 ug/dL — ABNORMAL LOW (ref 45–182)
Saturation Ratios: 16 % — ABNORMAL LOW (ref 17.9–39.5)
TIBC: 255 ug/dL (ref 250–450)
UIBC: 214 ug/dL

## 2019-12-29 LAB — FOLATE: Folate: 31.6 ng/mL (ref >5.9)

## 2019-12-29 LAB — FERRITIN: Ferritin: 288 ng/mL (ref 24–336)

## 2019-12-29 LAB — PROCALCITONIN: Procalcitonin: 0.29 ng/mL

## 2019-12-29 LAB — VITAMIN B12: Vitamin B-12: 325 pg/mL (ref 180–914)

## 2019-12-29 MED ORDER — FUROSEMIDE 10 MG/ML IJ SOLN
40.0000 mg | Freq: Once | INTRAMUSCULAR | Status: AC
  Administered 2019-12-29: 40 mg via INTRAVENOUS
  Filled 2019-12-29: qty 4

## 2019-12-29 MED ORDER — SODIUM CHLORIDE 0.9 % IV SOLN: INTRAVENOUS | Status: DC

## 2019-12-29 NOTE — Progress Notes (Signed)
CBG=347 and there's no sliding scale.  Notified [place] NP and received a new order for  Levemir 8 units SQ twice daily. Will administer and continue to monitor.

## 2019-12-29 NOTE — Progress Notes (Signed)
PROGRESS NOTE    Isaac Stewart  HQI:[id number] DOB: [birth year] DOA: 12/27/2019 PCP: Mina Marble, PA-C   Brief Narrative:  HPI per Dr. Wynetta Fines on 12/26/2018  Isaac Stewart is a 79 y.o. male with medical history significant of hypertension, hyperlipidemia, diabetespresenting to the ED with chief complaint of shortness of breath, fatigue, poor appetite.  Patient symptoms started 5 days ago with whole body aching generalized weakness and loss of smell.  Went to his PCP 3 days ago and diagnosed with Covid, has been having trouble getting around the house due to malaise, fatigue, not eating well, for the past 2 days with increased shortness of breath. Denies chest pain, no abdominal pain, no vomiting, no diarrhea. Symptoms moderate to severe, constant, no exacerbating or alleviating factors.   PCP planned to send patient to [facility] outpatient infusion center for multiple antibiotic treatment, but today patient went to urgent care for worsening of the symptoms and was sent here from urgent care for worsening of condition with AKI. ED Course: Was found to have hypoxia, but stabilized at 2 L.  **Interim History  Patient states that he is doing okay and feels a little bit better however still on supplemental oxygen via nasal cannula and now on 2 Liters.  Had a bowel movement yesterday morning.  Started on remdesivir and steroids which will be continued.  Inflammatory markers trending down.  We will repeat chest x-ray in the a.m. and have added inhaler therapy for the patient. Appeared a little volume overloaded so will give IV Lasix 40 mg x1 and reassess in the AM   Assessment & Plan:   Active Problems:   COVID-19 virus infection   COVID-19  Pneumonia 2/2 to COVID-19 Virus Acute Respiratory Failure with Hypoxia in the setting of COVID-19 -Admitted to Inpatient Medical Telemetry  -Was febrile the day before yesterday TMAx of 100.8; he is hemodynamically stable but does not complain of  SOB now  -Initial CXR showed "There is shallow inspiration with minimal bibasilar atelectasis. Bilateral interstitial and streaky densities primarily involving the right upper lung field most consistent with multifocal pneumonia, likely viral or atypical in etiology. Clinical correlation and follow-up recommended. No lobar consolidation, pleural effusion or pneumothorax. Stable cardiomediastinal silhouette. Atherosclerotic calcification of the aorta. No acute osseous pathology" -Checked Baseline Inflammatory Markers and Trend Daily: Recent Labs    12/27/19 1427 12/28/19 0337 12/29/19 0641  DDIMER 1.66* 1.51* 1.53*  FERRITIN 245 197 288  LDH 203*  --  198*  CRP 27.6* 24.4* 14.4*  -PCT went from 0.31 -> 0.39 -> 0.29 -Fibrinogen was 695 and is now 610  No results found for: SARSCOV2NAA  -SpO2: 98 % O2 Flow Rate (L/min): 2 L/min  -Started Treatment with 5 Days of Remdesivir and 10 Days of IV Decadron; Day 2/5 of Remdesivir and Day 2/10 of Steroids (6 mg of Dexamethasone) complete and going on Day 3 -May try Actemra and or Convalescent Plasma if not improving  -C/w Antitussives with Guaifenesin-Dextromethorphan 10 mL po q4hprn Cough and Chlorpheniramine-Hydrocodone 5 mL po q12hprn -Check Blood Cx x2 and showed NGTD <24 hours -Prone as much as possible  -C/w Acetaminophen for Fever  -C/w Zinc 220 mg po Daily and Vitamin C  -Airborne and Contact Precautions -C/w Combivent Scheduled 1 puff IH q6h and Albuterol 1-2 puff IH q4hprn -Add Flutter Valve and Incentive Spirometry  -Repeat CXR this AM pending to be done still  -Continue to monitor and trend inflammatory markers and repeat chest x-ray imaging  in the a.m. -Will need an Ambulatory Home O2 Screen prior to D/C  AKI on CKD Stage 3 Metabolic Acidosis -Patient's BUN/Cr went from 39/2.92 -> 45/2.73 -> 53/2.42 -Patient's CO2 was 20, AG was 14, and Chloride was 104; Now CO2 is 22, Chloride 108, AG is 10 -Avoid nephrotoxic medications,  contrast dyes, hypotension and renally adjust medications -Received a 500 mL Fluid Bolus; Was going to be started Gentle IVF Hydration with NS at 75 mL/hr for 12 hours but will stop that now and give IV Lasix as he appeared a little volume Overloaded -Give IV Lasix 40 mg x1  -Given Albumin 12.5 g q6h and now stopped -Continue to monitor and trend renal function -Repeat CMP in a.m.  HTN -C/w Nifedipine 90 mg po qMorning   HLD -C/w Fenofibrate 160 mg po Daily and Simvastatin 40 mg po qHS  Diabetes Mellitus Type 2, Poorly controlled -Patient's HbA1c was 8.2 -C/w Moderate Novolog SSI AC; Night Coverage added Levemir 8 mg sq BID -Likely to be further elevated in the setting of Steroid Demargination -CBG ranging from 141-347  Normocytic Anemia/Anemia of Chronic Kidney Disease -Patient's Hb/Hct went from 11.1/35.1 -> 9.9/29.6 -> 10.4/32.0 -Check Anemia Panel this AM and is pending  -Continue to Monitor for S/Sx of Bleeding -Repeat CBC in AM   Overweight -Estimated body mass index is 30.26 kg/m as calculated from the following:   Height as of this encounter: 5\' 11"  (1.803 m).   Weight as of this encounter: 98.4 kg.  DVT prophylaxis: Heparin 5,000 units sq q8h Code Status: FULL CODE Family Communication: No family present at bedside  Disposition Plan: Pending further clinical improvement and Improvement in respiratory Status along with Inflammatory markers; Anticipate D/C after completion of Remdesivir   Consultants:   None   Procedures: None  Antimicrobials: Anti-infectives (From admission, onward)   Start     Dose/Rate Route Frequency Ordered Stop   12/28/19 1000  remdesivir 100 mg in sodium chloride 0.9 % 100 mL IVPB     100 mg 200 mL/hr over 30 Minutes Intravenous Daily 12/27/19 1724 01/01/20 0959   12/27/19 1730  remdesivir 200 mg in sodium chloride 0.9% 250 mL IVPB     200 mg 580 mL/hr over 30 Minutes Intravenous Once 12/27/19 1724 12/27/19 2003      Subjective: Seen and examined at bedside and he states that he is doing good. No CP, SOB, Nausea or vomiting. States he slept ok. Also states "Nothing hurts." No other complaints or concerns at this time.   Objective: Vitals:   12/29/19 0100 12/29/19 0200 12/29/19 0600 12/29/19 0750  BP:    (!) 143/65  Pulse:    83  Resp: 19 19 16 17   Temp:    97.6 F (36.4 C)  TempSrc:    Oral  SpO2:    98%  Weight:      Height:        Intake/Output Summary (Last 24 hours) at 12/29/2019 0859 Last data filed at 12/28/2019 1700 Gross per 24 hour  Intake 630 ml  Output --  Net 630 ml   Filed Weights   12/28/19 1300  Weight: 98.4 kg   Examination: Physical Exam:  Constitutional: WN/WD obese Caucasian male in NAD and appears calm and comfortable Eyes: Lids and conjunctivae normal, sclerae anicteric  ENMT: External Ears, Nose appear normal. Grossly normal hearing. Mucous membranes are moist. Neck: Appears normal, supple, no cervical masses, normal ROM, no appreciable thyromegaly Respiratory: Diminished  to auscultation bilaterally  with coarse breath sounds, no wheezing, rales, rhonchi or crackles. Normal respiratory effort and patient is not tachypenic. No accessory muscle use. Unlabored breathing and wearing 2 Liters of Supplemental O2 via [place] Cardiovascular: RRR, no murmurs / rubs / gallops. S1 and S2 auscultated. 1+ LE extremity Pitting edema bilaterally.  Abdomen: Soft, non-tender, Distended due to body habitus. Bowel sounds positive x4.  GU: Deferred. Musculoskeletal: No clubbing / cyanosis of digits/nails. No joint deformity upper and lower extremities.  Skin: No rashes, lesions, ulcers on a limited skin evaluation. No induration; Warm and dry.  Neurologic: CN 2-12 grossly intact with no focal deficits. Romberg sign and cerebellar reflexes not assessed.  Psychiatric: Normal judgment and insight. Alert and oriented x 3. Pleasant mood and appropriate affect.   Data Reviewed: I have  personally reviewed following labs and imaging studies  CBC: Recent Labs  Lab 12/27/19 1427 12/28/19 0337 12/29/19 0641  WBC 7.9 4.7 5.4  NEUTROABS  --  3.8 4.1  HGB 11.1* 9.9* 10.4*  HCT 35.1* 29.6* 32.0*  MCV 92.1 87.8 88.4  PLT 231 214 160   Basic Metabolic Panel: Recent Labs  Lab 12/27/19 1427 12/28/19 0337 12/29/19 0641  NA 137 138 140  K 4.4 4.4 4.7  CL 101 104 108  CO2 22 20* 22  GLUCOSE 157* 212* 161*  BUN 39* 45* 53*  CREATININE 2.92* 2.73* 2.42*  CALCIUM 9.1 8.5* 8.9  MG  --  2.1 2.1  PHOS  --  3.9 3.0   GFR: Estimated Creatinine Clearance: 30.6 mL/min (A) (by C-G formula based on SCr of 2.42 mg/dL (H)). Liver Function Tests: Recent Labs  Lab 12/27/19 1427 12/28/19 0337 12/29/19 0641  AST 31 25 28   ALT 25 22 26   ALKPHOS 55 45 46  BILITOT 0.8 0.6 0.4  PROT 6.7 5.8* 6.5  ALBUMIN 3.1* 2.8* 3.2*   No results for input(s): LIPASE, AMYLASE in the last 168 hours. No results for input(s): AMMONIA in the last 168 hours. Coagulation Profile: No results for input(s): INR, PROTIME in the last 168 hours. Cardiac Enzymes: No results for input(s): CKTOTAL, CKMB, CKMBINDEX, TROPONINI in the last 168 hours. BNP (last 3 results) No results for input(s): PROBNP in the last 8760 hours. HbA1C: Recent Labs    12/27/19 2011  HGBA1C 8.2*   CBG: Recent Labs  Lab 12/28/19 0749 12/28/19 1119 12/28/19 1534 12/28/19 2031 12/29/19 0747  GLUCAP 188* 166* 231* 347* 141*   Lipid Profile: Recent Labs    12/27/19 1427  TRIG 147   Thyroid Function Tests: No results for input(s): TSH, T4TOTAL, FREET4, T3FREE, THYROIDAB in the last 72 hours. Anemia Panel: Recent Labs    12/28/19 0337 12/29/19 0641  FERRITIN 197 288   Sepsis Labs: Recent Labs  Lab 12/27/19 1427 12/28/19 0939 12/29/19 0641  PROCALCITON 0.31 0.39 0.29  LATICACIDVEN 1.6  --   --    Radiology Studies: DG Chest Port 1 View  Result Date: 12/27/2019 CLINICAL DATA:  79 year old male with  COVID-19. EXAM: PORTABLE CHEST 1 VIEW COMPARISON:  Chest radiograph dated 12/27/2019. FINDINGS: There is shallow inspiration with minimal bibasilar atelectasis. Bilateral interstitial and streaky densities primarily involving the right upper lung field most consistent with multifocal pneumonia, likely viral or atypical in etiology. Clinical correlation and follow-up recommended. No lobar consolidation, pleural effusion or pneumothorax. Stable cardiomediastinal silhouette. Atherosclerotic calcification of the aorta. No acute osseous pathology. IMPRESSION: Pneumonia.  Clinical correlation and follow-up recommended. Electronically Signed   By: Laren Everts.D.  On: 12/27/2019 19:30   Scheduled Meds: . ascorbic acid  500 mg Oral Daily  . aspirin EC  81 mg Oral QHS  . cholecalciferol  2,000 Units Oral q morning - 10a  . cloNIDine  0.2 mg Oral BID  . dexamethasone (DECADRON) injection  6 mg Intravenous Q24H  . fenofibrate  160 mg Oral q morning - 10a  . heparin  5,000 Units Subcutaneous Q8H  . insulin aspart  0-15 Units Subcutaneous TID WC  . insulin detemir  8 Units Subcutaneous BID  . Ipratropium-Albuterol  1 puff Inhalation Q6H  . multivitamin with minerals  1 tablet Oral Daily  . NIFEdipine  90 mg Oral q morning - 10a  . simvastatin  40 mg Oral QHS  . zinc sulfate  220 mg Oral Daily   Continuous Infusions: . sodium chloride    . remdesivir 100 mg in NS 100 mL Stopped (12/28/19 1328)    LOS: 2 days   Isaac Elbe, DO [facility] PAGER is on [place]  If 7PM-7AM, please contact night-coverage [email or web address]

## 2019-12-30 ENCOUNTER — Inpatient Hospital Stay (HOSPITAL_COMMUNITY): Payer: HMO

## 2019-12-30 LAB — PHOSPHORUS: Phosphorus: 3.7 mg/dL (ref 2.5–4.6)

## 2019-12-30 LAB — CBC WITH DIFFERENTIAL/PLATELET
Abs Immature Granulocytes: 0.02 10*3/uL (ref 0.00–0.07)
Basophils Absolute: 0 10*3/uL (ref 0.0–0.1)
Basophils Relative: 0 %
Eosinophils Absolute: 0 10*3/uL (ref 0.0–0.5)
Eosinophils Relative: 0 %
HCT: 31.7 % — ABNORMAL LOW (ref 39.0–52.0)
Hemoglobin: 10.4 g/dL — ABNORMAL LOW (ref 13.0–17.0)
Immature Granulocytes: 0 %
Lymphocytes Relative: 24 %
Lymphs Abs: 1.4 10*3/uL (ref 0.7–4.0)
MCH: 29.1 pg (ref 26.0–34.0)
MCHC: 32.8 g/dL (ref 30.0–36.0)
MCV: 88.5 fL (ref 80.0–100.0)
Monocytes Absolute: 0.4 10*3/uL (ref 0.1–1.0)
Monocytes Relative: 6 %
Neutro Abs: 4.1 10*3/uL (ref 1.7–7.7)
Neutrophils Relative %: 70 %
Platelets: 323 10*3/uL (ref 150–400)
RBC: 3.58 MIL/uL — ABNORMAL LOW (ref 4.22–5.81)
RDW: 13.6 % (ref 11.5–15.5)
WBC: 5.9 10*3/uL (ref 4.0–10.5)
nRBC: 0 % (ref 0.0–0.2)

## 2019-12-30 LAB — COMPREHENSIVE METABOLIC PANEL
ALT: 26 U/L (ref 0–44)
AST: 28 U/L (ref 15–41)
Albumin: 3.1 g/dL — ABNORMAL LOW (ref 3.5–5.0)
Alkaline Phosphatase: 44 U/L (ref 38–126)
Anion gap: 11 (ref 5–15)
BUN: 53 mg/dL — ABNORMAL HIGH (ref 8–23)
CO2: 25 mmol/L (ref 22–32)
Calcium: 8.6 mg/dL — ABNORMAL LOW (ref 8.9–10.3)
Chloride: 105 mmol/L (ref 98–111)
Creatinine, Ser: 2.26 mg/dL — ABNORMAL HIGH (ref 0.61–1.24)
GFR calc Af Amer: 31 mL/min — ABNORMAL LOW (ref >60)
GFR calc non Af Amer: 27 mL/min — ABNORMAL LOW (ref >60)
Glucose, Bld: 121 mg/dL — ABNORMAL HIGH (ref 70–99)
Potassium: 4.1 mmol/L (ref 3.5–5.1)
Sodium: 141 mmol/L (ref 135–145)
Total Bilirubin: 0.8 mg/dL (ref 0.3–1.2)
Total Protein: 6.3 g/dL — ABNORMAL LOW (ref 6.5–8.1)

## 2019-12-30 LAB — GLUCOSE, CAPILLARY
Glucose-Capillary: 85 mg/dL (ref 70–99)
Glucose-Capillary: 132 mg/dL — ABNORMAL HIGH (ref 70–99)
Glucose-Capillary: 281 mg/dL — ABNORMAL HIGH (ref 70–99)
Glucose-Capillary: 199 mg/dL — ABNORMAL HIGH (ref 70–99)

## 2019-12-30 LAB — C-REACTIVE PROTEIN: CRP: 8.5 mg/dL — ABNORMAL HIGH (ref <1.0)

## 2019-12-30 LAB — LACTATE DEHYDROGENASE: LDH: 184 U/L (ref 98–192)

## 2019-12-30 LAB — SEDIMENTATION RATE: Sed Rate: 41 mm/hr — ABNORMAL HIGH (ref 0–16)

## 2019-12-30 LAB — PROCALCITONIN: Procalcitonin: 0.13 ng/mL

## 2019-12-30 LAB — FIBRINOGEN: Fibrinogen: 603 mg/dL — ABNORMAL HIGH (ref 210–475)

## 2019-12-30 LAB — MAGNESIUM: Magnesium: 2.2 mg/dL (ref 1.7–2.4)

## 2019-12-30 LAB — FERRITIN: Ferritin: 274 ng/mL (ref 24–336)

## 2019-12-30 LAB — D-DIMER, QUANTITATIVE: D-Dimer, Quant: 1.18 ug/mL-FEU — ABNORMAL HIGH (ref 0.00–0.50)

## 2019-12-30 MED ORDER — FUROSEMIDE 10 MG/ML IJ SOLN
40.0000 mg | Freq: Once | INTRAMUSCULAR | Status: AC
  Administered 2019-12-30: 40 mg via INTRAVENOUS
  Filled 2019-12-30: qty 4

## 2019-12-30 NOTE — Progress Notes (Signed)
PROGRESS NOTE    Isaac Stewart  GBT:[id number] DOB: 1941/07/23 DOA: 12/27/2019 PCP: Mina Marble, PA-C   Brief Narrative:  HPI per Dr. Wynetta Fines on 12/26/2018  Isaac Stewart is a 79 y.o. male with medical history significant of hypertension, hyperlipidemia, diabetespresenting to the ED with chief complaint of shortness of breath, fatigue, poor appetite.  Patient symptoms started 5 days ago with whole body aching generalized weakness and loss of smell.  Went to his PCP 3 days ago and diagnosed with Covid, has been having trouble getting around the house due to malaise, fatigue, not eating well, for the past 2 days with increased shortness of breath. Denies chest pain, no abdominal pain, no vomiting, no diarrhea. Symptoms moderate to severe, constant, no exacerbating or alleviating factors.   PCP planned to send patient to [facility] outpatient infusion center for multiple antibiotic treatment, but today patient went to urgent care for worsening of the symptoms and was sent here from urgent care for worsening of condition with AKI. ED Course: Was found to have hypoxia, but stabilized at 2 L.  **Interim History  Patient states that he is doing okay and feels a little bit better however still on supplemental oxygen via nasal cannula and now on 2 Liters.    Started on remdesivir and steroids which will be continued.  Inflammatory markers trending down and improving .  We will repeat chest x-ray again in the a.m. and have added inhaler therapy for the patient. CXR this am shows some improvement  Appeared a little volume overloaded yesterday so will give IV Lasix 40 mg x1 and will repeat this AM as he continues to be Volume Overloaded .   Assessment & Plan:   Active Problems:   COVID-19 virus infection   COVID-19  Pneumonia 2/2 to COVID-19 Virus Acute Respiratory Failure with Hypoxia in the setting of COVID-19 -Admitted to Inpatient Medical Telemetry  -Has been afebrile; He is  hemodynamically stable but does not complain of SOB now  -Initial CXR showed "There is shallow inspiration with minimal bibasilar atelectasis. Bilateral interstitial and streaky densities primarily involving the right upper lung field most consistent with multifocal pneumonia, likely viral or atypical in etiology. Clinical correlation and follow-up recommended. No lobar consolidation, pleural effusion or pneumothorax. Stable cardiomediastinal silhouette. Atherosclerotic calcification of the aorta. No acute osseous pathology" -Checked Baseline Inflammatory Markers and Trend Daily: Recent Labs    12/27/19 1427 12/27/19 1427 12/28/19 0337 12/29/19 0641 12/30/19 0624  DDIMER 1.66*   < > 1.51* 1.53* 1.18*  FERRITIN 245   < > 197 288 274  LDH 203*  --   --  198* 184  CRP 27.6*   < > 24.4* 14.4* 8.5*   < > = values in this interval not displayed.  -PCT went from 0.31 -> 0.39 -> 0.29 -Fibrinogen was 695 -> 610 -> 603 -ESR went from 55 -> 41 No results found for: SARSCOV2NAA  -SpO2: 96 % O2 Flow Rate (L/min): 2 L/min; Have asked Nurse to wean to Room AIr -Started Treatment with 5 Days of Remdesivir and 10 Days of IV Decadron; Day 3/5 of Remdesivir and Day 3/10 of Steroids (6 mg of Dexamethasone) complete and going on Day 4 -May try Actemra and or Convalescent Plasma if not improving  -C/w Antitussives with Guaifenesin-Dextromethorphan 10 mL po q4hprn Cough and Chlorpheniramine-Hydrocodone 5 mL po q12hprn -Check Blood Cx x2 and showed NGTD at 2 Days  -Prone as much as possible  -C/w Acetaminophen for Fever  -  C/w Zinc 220 mg po Daily and Vitamin C  -Airborne and Contact Precautions -C/w Combivent Scheduled 1 puff IH q6h and Albuterol 1-2 puff IH q4hprn -Add Flutter Valve and Incentive Spirometry  -Repeat CXR this AM showed "Aeration of both lower lobes is improving. Persistent right upper lobe opacity is noted. Heart size is normal. There is no edema or Effusion." -Continue to monitor and  trend inflammatory markers and repeat chest x-ray imaging in the a.m. -Will need an Ambulatory Home O2 Screen prior to D/C and will order it for today  AKI on CKD Stage 3, improving  Metabolic Acidosis -Patient's BUN/Cr went from 39/2.92 -> 45/2.73 -> 53/2.42 -> 53/2.26 -Patient's CO2 was 20, AG was 14, and Chloride was 104; Now CO2 is 25, Chloride 105, AG is 11 -Avoid nephrotoxic medications, contrast dyes, hypotension and renally adjust medications -Received a 500 mL Fluid Bolus; Was going to be started Gentle IVF Hydration with NS at 75 mL/hr for 12 hours but will stop that now and give IV Lasix as he appeared a little volume Overloaded -Give IV Lasix 40 mg x1 yesterday and will give again this AM as he continues to be volume overloaded -Given Albumin 12.5 g q6h and now stopped -Continue to monitor and trend renal function -Repeat CMP in a.m.  HTN -C/w Nifedipine 90 mg po qMorning  -Give IV Lasix 40 mg x1 this AM  HLD -C/w Fenofibrate 160 mg po Daily and Simvastatin 40 mg po qHS  Diabetes Mellitus Type 2, Poorly controlled -Patient's HbA1c was 8.2 -C/w Moderate Novolog SSI AC; Night Coverage added Levemir 8 mg sq BID -Likely to be further elevated in the setting of Steroid Demargination -CBG ranging from 85-236; Blood Sugar this AM on CMP was 121  Normocytic Anemia/Anemia of Chronic Kidney Disease -Patient's Hb/Hct went from 11.1/35.1 -> 9.9/29.6 -> 10.4/32.0 -> 10.4/31.7 -Checked Anemia Panel and showed Iron level of 41, UIBC of 214, TIBC of 255, Saturation Ratios of 16, Ferritin 288, Folate 31.6, and Vitamin B12 of 325  -Continue to Monitor for S/Sx of Bleeding -Repeat CBC in AM   Overweight -Estimated body mass index is 30.26 kg/m as calculated from the following:   Height as of this encounter: '5\' 11"'  (1.803 m).   Weight as of this encounter: 98.4 kg.  DVT prophylaxis: Heparin 5,000 units sq q8h Code Status: FULL CODE Family Communication: No family present at bedside    Disposition Plan: Pending further clinical improvement and Improvement in respiratory Status along with Inflammatory markers; Anticipate D/C after completion of Remdesivir   Consultants:   None   Procedures: None  Antimicrobials: Anti-infectives (From admission, onward)   Start     Dose/Rate Route Frequency Ordered Stop   12/28/19 1000  remdesivir 100 mg in sodium chloride 0.9 % 100 mL IVPB     100 mg 200 mL/hr over 30 Minutes Intravenous Daily 12/27/19 1724 01/01/20 0959   12/27/19 1730  remdesivir 200 mg in sodium chloride 0.9% 250 mL IVPB     200 mg 580 mL/hr over 30 Minutes Intravenous Once 12/27/19 1724 12/27/19 2003     Subjective: Seen and examined at bedside and states he is doing well. Has had a BM every morning. No CP or SOB. Has been ambulating to the bathroom without O2. States Flutter Valve and Incentive Spirometry help. No lightheadedness or dizziness. No other concerns or complaints at this time.  Objective: Vitals:   12/29/19 1719 12/29/19 2055 12/30/19 0300 12/30/19 0400  BP: (!) 157/75 Marland Kitchen)  152/75    Pulse: 81 81    Resp: 18  17 (!) 22  Temp: 97.6 F (36.4 C) (!) 97.5 F (36.4 C)    TempSrc: Oral Oral    SpO2: 98% 96%    Weight:      Height:       No intake or output data in the 24 hours ending 12/30/19 0745 Filed Weights   12/28/19 1300  Weight: 98.4 kg   Examination: Physical Exam:  Constitutional: WN/WD obese Caucasian male in NAD and appears calm and comfortable Eyes: Lids and conjunctivae normal, sclerae anicteric  ENMT: External Ears, Nose appear normal. Grossly normal hearing. Mucous membranes are moist.  Neck: Appears normal, supple, no cervical masses, normal ROM, no appreciable thyromegaly; no JVD Respiratory: Diminished to auscultation bilaterally with coarse breath sounds, no wheezing, rales, rhonchi or crackles. Normal respiratory effort and patient is not tachypenic. No accessory muscle use. Unlabored breathing but was wearing 2 Liters  of Supplemental O2 Cardiovascular: RRR, no murmurs / rubs / gallops. S1 and S2 auscultated. Continues to have 1+ LE extremity edema.  Abdomen: Soft, non-tender, Distended 2/2 to body habitus. Bowel sounds positive x4.  GU: Deferred. Musculoskeletal: No clubbing / cyanosis of digits/nails. No joint deformity upper and lower extremities.  Skin: No rashes, lesions, ulcers on a limited skin evalution. No induration; Warm and dry.  Neurologic: CN 2-12 grossly intact with no focal deficits. Romberg sign and cerebellar reflexes not assessed.  Psychiatric: Normal judgment and insight. Alert and oriented x 3. Normal mood and appropriate affect.   Data Reviewed: I have personally reviewed following labs and imaging studies  CBC: Recent Labs  Lab 12/27/19 1427 12/28/19 0337 12/29/19 0641 12/30/19 0624  WBC 7.9 4.7 5.4 5.9  NEUTROABS  --  3.8 4.1 4.1  HGB 11.1* 9.9* 10.4* 10.4*  HCT 35.1* 29.6* 32.0* 31.7*  MCV 92.1 87.8 88.4 88.5  PLT 231 214 297 017   Basic Metabolic Panel: Recent Labs  Lab 12/27/19 1427 12/28/19 0337 12/29/19 0641 12/30/19 0624  NA 137 138 140 141  K 4.4 4.4 4.7 4.1  CL 101 104 108 105  CO2 22 20* 22 25  GLUCOSE 157* 212* 161* 121*  BUN 39* 45* 53* 53*  CREATININE 2.92* 2.73* 2.42* 2.26*  CALCIUM 9.1 8.5* 8.9 8.6*  MG  --  2.1 2.1 2.2  PHOS  --  3.9 3.0 3.7   GFR: Estimated Creatinine Clearance: 32.7 mL/min (A) (by C-G formula based on SCr of 2.26 mg/dL (H)). Liver Function Tests: Recent Labs  Lab 12/27/19 1427 12/28/19 0337 12/29/19 0641 12/30/19 0624  AST '31 25 28 28  ' ALT '25 22 26 26  ' ALKPHOS 55 45 46 44  BILITOT 0.8 0.6 0.4 0.8  PROT 6.7 5.8* 6.5 6.3*  ALBUMIN 3.1* 2.8* 3.2* 3.1*   No results for input(s): LIPASE, AMYLASE in the last 168 hours. No results for input(s): AMMONIA in the last 168 hours. Coagulation Profile: No results for input(s): INR, PROTIME in the last 168 hours. Cardiac Enzymes: No results for input(s): CKTOTAL, CKMB,  CKMBINDEX, TROPONINI in the last 168 hours. BNP (last 3 results) No results for input(s): PROBNP in the last 8760 hours. HbA1C: Recent Labs    12/27/19 2011  HGBA1C 8.2*   CBG: Recent Labs  Lab 12/29/19 0747 12/29/19 1007 12/29/19 1220 12/29/19 1716 12/29/19 2039  GLUCAP 141* 186* 176* 231* 236*   Lipid Profile: Recent Labs    12/27/19 1427  TRIG 147  Thyroid Function Tests: No results for input(s): TSH, T4TOTAL, FREET4, T3FREE, THYROIDAB in the last 72 hours. Anemia Panel: Recent Labs    12/29/19 0641 12/30/19 0624  VITAMINB12 325  --   FOLATE 31.6  --   FERRITIN 288 274  TIBC 255  --   IRON 41*  --   RETICCTPCT 0.6  --    Sepsis Labs: Recent Labs  Lab 12/27/19 1427 12/28/19 0939 12/29/19 0641  PROCALCITON 0.31 0.39 0.29  LATICACIDVEN 1.6  --   --    Radiology Studies: DG CHEST PORT 1 VIEW  Result Date: 12/29/2019 CLINICAL DATA:  79 year old male with history of shortness of breath. COVID-19 infection. EXAM: PORTABLE CHEST 1 VIEW COMPARISON:  Chest x-ray 12/27/2019. FINDINGS: Lung volumes remain slightly low, but have increased. There are persistent but improving patchy ill-defined opacities and areas of interstitial prominence throughout the mid to lower lungs bilaterally (right greater than left). No pleural effusions. No evidence of pulmonary edema. Heart size is borderline enlarged. Upper mediastinal contours are within normal limits. Aortic atherosclerosis. IMPRESSION: 1. Slight improvement in multilobar bilateral pneumonia, as above. 2. Aortic atherosclerosis. Electronically Signed   By: Vinnie Langton M.D.   On: 12/29/2019 10:32   Scheduled Meds: . ascorbic acid  500 mg Oral Daily  . aspirin EC  81 mg Oral QHS  . cholecalciferol  2,000 Units Oral q morning - 10a  . cloNIDine  0.2 mg Oral BID  . dexamethasone (DECADRON) injection  6 mg Intravenous Q24H  . fenofibrate  160 mg Oral q morning - 10a  . heparin  5,000 Units Subcutaneous Q8H  . insulin  aspart  0-15 Units Subcutaneous TID WC  . insulin detemir  8 Units Subcutaneous BID  . Ipratropium-Albuterol  1 puff Inhalation Q6H  . multivitamin with minerals  1 tablet Oral Daily  . NIFEdipine  90 mg Oral q morning - 10a  . simvastatin  40 mg Oral QHS  . zinc sulfate  220 mg Oral Daily   Continuous Infusions: . remdesivir 100 mg in NS 100 mL 100 mg (12/29/19 1051)    LOS: 3 days   Kerney Elbe, DO [facility] PAGER is on [place]  If 7PM-7AM, please contact night-coverage [email or web address]

## 2019-12-30 NOTE — Progress Notes (Signed)
SATURATION QUALIFICATIONS: (This note is used to comply with regulatory documentation for home oxygen)  Patient Saturations on Room Air at Rest = 97%  Patient Saturations on Room Air while Ambulating = 98%  Patient Saturations on n/a Liters of oxygen while Ambulating = n/a%  Please briefly explain why patient needs home oxygen:Patient did not c/o any shortness of breath or dizziness while ambulating in room

## 2019-12-31 ENCOUNTER — Inpatient Hospital Stay (HOSPITAL_COMMUNITY): Payer: HMO

## 2019-12-31 LAB — CBC WITH DIFFERENTIAL/PLATELET
Abs Immature Granulocytes: 0.05 10*3/uL (ref 0.00–0.07)
Basophils Absolute: 0 10*3/uL (ref 0.0–0.1)
Basophils Relative: 0 %
Eosinophils Absolute: 0 10*3/uL (ref 0.0–0.5)
Eosinophils Relative: 0 %
HCT: 33.1 % — ABNORMAL LOW (ref 39.0–52.0)
Hemoglobin: 11.2 g/dL — ABNORMAL LOW (ref 13.0–17.0)
Immature Granulocytes: 1 %
Lymphocytes Relative: 32 %
Lymphs Abs: 2.6 10*3/uL (ref 0.7–4.0)
MCH: 29.5 pg (ref 26.0–34.0)
MCHC: 33.8 g/dL (ref 30.0–36.0)
MCV: 87.1 fL (ref 80.0–100.0)
Monocytes Absolute: 0.6 10*3/uL (ref 0.1–1.0)
Monocytes Relative: 7 %
Neutro Abs: 4.9 10*3/uL (ref 1.7–7.7)
Neutrophils Relative %: 60 %
Platelets: 355 10*3/uL (ref 150–400)
RBC: 3.8 MIL/uL — ABNORMAL LOW (ref 4.22–5.81)
RDW: 13.3 % (ref 11.5–15.5)
WBC: 8.2 10*3/uL (ref 4.0–10.5)
nRBC: 0 % (ref 0.0–0.2)

## 2019-12-31 LAB — SEDIMENTATION RATE: Sed Rate: 59 mm/hr — ABNORMAL HIGH (ref 0–16)

## 2019-12-31 LAB — COMPREHENSIVE METABOLIC PANEL
ALT: 36 U/L (ref 0–44)
AST: 30 U/L (ref 15–41)
Albumin: 3.1 g/dL — ABNORMAL LOW (ref 3.5–5.0)
Alkaline Phosphatase: 44 U/L (ref 38–126)
Anion gap: 10 (ref 5–15)
BUN: 54 mg/dL — ABNORMAL HIGH (ref 8–23)
CO2: 27 mmol/L (ref 22–32)
Calcium: 8.9 mg/dL (ref 8.9–10.3)
Chloride: 103 mmol/L (ref 98–111)
Creatinine, Ser: 2.25 mg/dL — ABNORMAL HIGH (ref 0.61–1.24)
GFR calc Af Amer: 31 mL/min — ABNORMAL LOW (ref >60)
GFR calc non Af Amer: 27 mL/min — ABNORMAL LOW (ref >60)
Glucose, Bld: 118 mg/dL — ABNORMAL HIGH (ref 70–99)
Potassium: 4.6 mmol/L (ref 3.5–5.1)
Sodium: 140 mmol/L (ref 135–145)
Total Bilirubin: 0.5 mg/dL (ref 0.3–1.2)
Total Protein: 6.3 g/dL — ABNORMAL LOW (ref 6.5–8.1)

## 2019-12-31 LAB — GLUCOSE, CAPILLARY
Glucose-Capillary: 57 mg/dL — ABNORMAL LOW (ref 70–99)
Glucose-Capillary: 162 mg/dL — ABNORMAL HIGH (ref 70–99)

## 2019-12-31 LAB — LACTATE DEHYDROGENASE: LDH: 187 U/L (ref 98–192)

## 2019-12-31 LAB — MAGNESIUM: Magnesium: 2.2 mg/dL (ref 1.7–2.4)

## 2019-12-31 LAB — PHOSPHORUS: Phosphorus: 3.5 mg/dL (ref 2.5–4.6)

## 2019-12-31 LAB — FERRITIN: Ferritin: 251 ng/mL (ref 24–336)

## 2019-12-31 LAB — C-REACTIVE PROTEIN: CRP: 6.1 mg/dL — ABNORMAL HIGH (ref <1.0)

## 2019-12-31 LAB — FIBRINOGEN: Fibrinogen: 589 mg/dL — ABNORMAL HIGH (ref 210–475)

## 2019-12-31 LAB — D-DIMER, QUANTITATIVE: D-Dimer, Quant: 0.79 ug/mL-FEU — ABNORMAL HIGH (ref 0.00–0.50)

## 2019-12-31 MED ORDER — GUAIFENESIN-DM 100-10 MG/5ML PO SYRP: 10.0000 mL | ORAL_SOLUTION | ORAL | 0 refills | Status: DC | PRN

## 2019-12-31 MED ORDER — DEXAMETHASONE 6 MG PO TABS: 6.0000 mg | ORAL_TABLET | Freq: Every day | ORAL | 0 refills | Status: AC

## 2019-12-31 MED ORDER — INSULIN DETEMIR 100 UNIT/ML ~~LOC~~ SOLN
5.0000 [IU] | Freq: Two times a day (BID) | SUBCUTANEOUS | Status: DC
  Filled 2019-12-31: qty 0.05

## 2019-12-31 MED ORDER — FUROSEMIDE 10 MG/ML IJ SOLN
60.0000 mg | Freq: Once | INTRAMUSCULAR | Status: AC
  Administered 2019-12-31: 60 mg via INTRAVENOUS
  Filled 2019-12-31: qty 6

## 2019-12-31 MED ORDER — ALBUTEROL SULFATE HFA 108 (90 BASE) MCG/ACT IN AERS: 1.0000 | INHALATION_SPRAY | RESPIRATORY_TRACT | 0 refills | Status: DC | PRN

## 2019-12-31 MED ORDER — POLYETHYLENE GLYCOL 3350 17 G PO PACK: 17.0000 g | PACK | Freq: Every day | ORAL | 0 refills | Status: DC | PRN

## 2019-12-31 MED ORDER — ZINC SULFATE 220 (50 ZN) MG PO CAPS: 220.0000 mg | ORAL_CAPSULE | Freq: Every day | ORAL | 0 refills | Status: DC

## 2019-12-31 MED ORDER — ASCORBIC ACID 500 MG PO TABS: 500.0000 mg | ORAL_TABLET | Freq: Every day | ORAL | 0 refills | Status: DC

## 2019-12-31 NOTE — Progress Notes (Signed)
Patient brady down to 49 x 2 and he was asymptomatic on reassessment. Notified [place] NP without any new order. Will continue to monitor.

## 2019-12-31 NOTE — Progress Notes (Signed)
Physical Therapy Treatment & Discharge Patient Details Name: Isaac Stewart MRN: [id number] DOB: [birth year] Today's Date: 12/31/2019    History of Present Illness Pt is a 79 y.o. male admitted 12/27/19 with SOB, fatigue and poor appetite; had been (+) COVID-19 three days prior with plans for oupatient infusions at [facility], but pt went to urgent care instead. Worked up for COVID and AKI. PMH includes HTN, HLD, DM.   PT Comments    Pt has progressed well with mobility. Independent with ambulation and ADL tasks. SpO2 >/94% on RA with activity. Educ re: energy conservation, therex, importance of continued mobility. Pt preparing for d/c home with wife who has recovered from [place] at home. Pt has met short-term acute PT goals; no follow-up needs. Will d/c PT.   Follow Up Recommendations  No PT follow up;Supervision - Intermittent     Equipment Recommendations  None recommended by PT    Recommendations for Other Services       Precautions / Restrictions Precautions Precautions: None Restrictions Weight Bearing Restrictions: No    Mobility  Bed Mobility Overal bed mobility: Independent                Transfers Overall transfer level: Independent                  Ambulation/Gait Ambulation/Gait assistance: Independent   Assistive device: None Gait Pattern/deviations: WFL(Within Functional Limits)   Gait velocity interpretation: 1.31 - 2.62 ft/sec, indicative of limited community ambulator General Gait Details: Independent ambulating multiple laps in room. SpO2 >/94% on RA   Stairs             Wheelchair Mobility    Modified Rankin (Stroke Patients Only)       Balance Overall balance assessment: No apparent balance deficits (not formally assessed)                                          Cognition Arousal/Alertness: Awake/alert Behavior During Therapy: WFL for tasks assessed/performed Overall Cognitive Status: Within Functional  Limits for tasks assessed                                        Exercises      General Comments General comments (skin integrity, edema, etc.): Educ re: energy conservation, IS/flutter valve use at home, therex for strength/endurance, importance of mobility      Pertinent Vitals/Pain Pain Assessment: No/denies pain    Home Living                      Prior Function            PT Goals (current goals can now be found in the care plan section) Progress towards PT goals: Goals met/education completed, patient discharged from PT    Frequency    Min 3X/week      PT Plan Current plan remains appropriate    Co-evaluation              AM-PAC PT "6 Clicks" Mobility   Outcome Measure  Help needed turning from your back to your side while in a flat bed without using bedrails?: None Help needed moving from lying on your back to sitting on the side of a flat bed without using bedrails?: None Help needed  moving to and from a bed to a chair (including a wheelchair)?: None Help needed standing up from a chair using your arms (e.g., wheelchair or bedside chair)?: None Help needed to walk in hospital room?: None Help needed climbing 3-5 steps with a railing? : None 6 Click Score: 24    End of Session   Activity Tolerance: Patient tolerated treatment well Patient left: in chair;with call bell/phone within reach Nurse Communication: Mobility status PT Visit Diagnosis: Muscle weakness (generalized) (M62.81)     Time: 5259-1028 PT Time Calculation (min) (ACUTE ONLY): 11 min  Charges:  $Self Care/Home Management: 8-22                    Mabeline Caras, PT, DPT Acute Rehabilitation Services  Pager [phone] Office [place] 12/31/2019, 2:57 PM

## 2019-12-31 NOTE — Discharge Summary (Signed)
Physician Discharge Summary  Isaac Stewart GSU:[id number] DOB: [birth year] DOA: 12/27/2019  PCP: Mina Marble, PA-C  Admit date: 12/27/2019 Discharge date: 12/31/2019  Admitted From: Home Disposition: Home  Recommendations for Outpatient Follow-up:  1. Follow up with PCP in 1-2 weeks 2. Follow-up with nephrology for CKD 3. Please obtain CMP/CBC, Mag, Phos in one week 4. Please follow up on the following pending results:  Home Health: No Equipment/Devices: None    Discharge Condition: Stable   CODE STATUS: FULL CODE  Diet recommendation: Heart Healthy Carb Modified Diet   Brief/Interim Summary: HPI per Dr. Wynetta Fines on 12/26/2018 Isaac Stewart a 79 y.o.malewith medical history significant ofhypertension, hyperlipidemia, diabetespresenting to the ED with chief complaint ofshortness of breath,fatigue,poor appetite.Patient symptoms started 5 days ago withwhole body aching generalized weaknessandloss of smell.Went to his PCP 3 days ago and diagnosed with Covid, has been having trouble getting around the house due to malaise, fatigue, not eating well, for the past 2 days with increased shortness of breath. Denies chest pain, no abdominal pain, no vomiting, no diarrhea. Symptoms moderate to severe, constant, no exacerbating or alleviating factors.PCP planned to send patient to [facility] outpatient infusion center for multiple antibiotic treatment,but today patient went to urgent care for worsening of the symptoms and was sent here from urgent care for worsening of condition with AKI. ED Course:Was found to have hypoxia, but stabilized at 2 L.  **Interim History  Patient states that he is doing okay and feels a little bit better however still on supplemental oxygen via nasal cannula and now on 2 Liters.    Started on remdesivir and steroids he completed his remdesivir course. Inflammatory markers trending down and improving .  We will repeat chest x-ray again  in the a.m. and have added inhaler therapy for the patient. CXR yesterday am shows some improvement continue to appear slightly volume overloaded so given IV Lasix 40 mg daily for 2 days and will give a dose of 60 prior to discharge today. Patient is to follow-up with his nephrologist as well as PCP. Patient ambulated and did not desaturate yesterday and was weaned off oxygen. He is feeling well and is stable for discharge. He was recommended home isolation guidelines and told to come back to the ED if he worsened.  Discharge Diagnoses:  Active Problems:   COVID-19 virus infection   COVID-19 Pneumonia 2/2 to COVID-19 Virus Acute Respiratory Failure with Hypoxia in the setting of COVID-19 -Admitted to Inpatient Medical Telemetry  -Has been afebrile; He is hemodynamically stable but does not complain of SOB now  -Initial CXR showed"There is shallow inspiration with minimal bibasilar atelectasis. Bilateral interstitial and streaky densities primarily involving the right upper lung field most consistent with multifocal pneumonia, likely viral or atypical in etiology. Clinical correlation and follow-up recommended. No lobar consolidation, pleural effusion or pneumothorax. Stable cardiomediastinal silhouette. Atherosclerotic calcification of the aorta. No acute osseous pathology" -Checked Baseline Inflammatory Markers and Trend Daily: Recent Labs (last 2 labs)        Recent Labs    12/29/19 0641 12/30/19 0624 12/31/19 0318  DDIMER 1.53* 1.18* 0.79*  FERRITIN 288 274 251  LDH 198* 184 187  CRP 14.4* 8.5* 6.1*    -PCT went from 0.31 -> 0.39 -> 0.29 -Fibrinogen was 695 -> 610 -> 603 -ESR went from 55 -> 41 Recent Labs  No results found for: SARSCOV2NAA    -SpO2: 97 % O2 Flow Rate (L/min): 2 L/min: 2 L/min; Have asked Nurse to  wean to Room Air and he was on it this AM  -Started Treatment with 5 Days of Remdesivir and 10 Days of IV Decadron; Day 5/5 of Remdesivir and Day 5/10 of Steroids (6 mg  of Dexamethasone) complete and anticipating D/C home after todays dose is given  -May tryActemra and or Convalescent Plasma if not improvingbut will hold off -C/w Antitussives with Guaifenesin-Dextromethorphan 10 mL po q4hprn Cough and Chlorpheniramine-Hydrocodone 5 mL po q12hprn -Check Blood Cx x2 and showed NGTD at 3 Days  -Prone as much as possible -C/w Acetaminophen for Fever  -C/w Zinc 220 mg po Daily and Vitamin C  -Airborne and Contact Precautions -C/w Combivent Scheduled1 puff IH q6hand Albuterol 1-2 puff IH q4hprn -Add Flutter Valve and Incentive Spirometry  -Repeat CXR yesterday AM showed "Aeration of both lower lobes is improving. Persistent right upper lobe opacity is noted. Heart size is normal. There is no edema or Effusion." -Continue to monitor and trend inflammatory markers and repeat chest x-ray imaging in the a.m. -Will need an Ambulatory Home O2 Screen prior to D/C and he did not desaturate or require any Supplemental O2 via [place]  AKI on CKD Stage 3, improving  Metabolic Acidosis -Patient's BUN/Cr went from 39/2.92 -> 45/2.73 -> 53/2.42 -> 53/2.26 -> 54/2.25 -Patient's CO2 was 20, AG was 14, and Chloride was 104; Now CO2 is 27, Chloride 103, AG is 10 -Avoid nephrotoxic medications, contrast dyes, hypotension and renally adjust medications -Received a 500 mL Fluid Bolus; Was going to be started Gentle IVF Hydration with NS at 75 mL/hr for 12 hours but will stop that now and give IV Lasix as he appeared a little volume Overloaded -Give IV Lasix 40 mg x1 the day before yesterday and was given again yesterday AM as he continues to be volume overloaded; Will give 60 mg of IV Lasix today prior to D/C -Given Albumin 12.5 g q6h and now stopped -Continue to monitor and trend renal function -Repeat CMP within 1 week and Follow up with Nephrology   HTN -C/w Nifedipine 90 mg po qMorning  -C/w Clonidine  -Give IV Lasix 40 mg x1 this AM  HLD -C/w Fenofibrate 160 mg po  Daily and Simvastatin 40 mg po qHS  Diabetes Mellitus Type 2, Poorly controlled -Patient's HbA1c was 8.2 -C/w Moderate Novolog SSI AC; Night Coverage added Levemir 8 mg sq BID and reduced now given Hypoglycemia  -Likely to be further elevated in the setting of Steroid Demargination -CBG ranging from 57-281; Blood Sugar this AM on CMP was 121  Normocytic Anemia/Anemia of Chronic Kidney Disease -Patient's Hb/Hct went from 11.1/35.1 -> 9.9/29.6 -> 10.4/32.0 -> 10.4/31.7 -> 11.2/33.1 -Checked Anemia Panel and showed Iron level of 41, UIBC of 214, TIBC of 255, Saturation Ratios of 16, Ferritin 288, Folate 31.6, and Vitamin B12 of 325  -Continue to Monitor for S/Sx of Bleeding -Repeat CBC in AM   Overweight -Estimated body mass index is 30.26 kg/m as calculated from the following:   Height as of this encounter: '5\' 11"'  (1.803 m).   Weight as of this encounter: 98.4 kg. -Weight Loss and Dietary Counseling given   Discharge Instructions  Allergies as of 12/31/2019   No Known Allergies     Medication List    TAKE these medications   albuterol 108 (90 Base) MCG/ACT inhaler Commonly known as: VENTOLIN HFA Inhale 1-2 puffs into the lungs every 4 (four) hours as needed for wheezing or shortness of breath.   APPLE CIDER VINEGAR PO Take  2 tablets by mouth daily with breakfast.   ascorbic acid 500 MG tablet Commonly known as: VITAMIN C Take 1 tablet (500 mg total) by mouth daily.   aspirin EC 81 MG tablet Take 81 mg by mouth at bedtime.   CINNAMON PO Take 2 capsules by mouth daily.   cloNIDine 0.2 MG tablet Commonly known as: CATAPRES Take 0.2 mg by mouth 2 (two) times daily.   dexamethasone 6 MG tablet Commonly known as: DECADRON Take 1 tablet (6 mg total) by mouth daily for 5 days.   diphenhydrAMINE 25 MG tablet Commonly known as: BENADRYL Take 50 mg by mouth every morning.   fenofibrate 160 MG tablet Take 160 mg by mouth every morning.   Fifty50 Pen Needles 32G X 4  MM Misc Generic drug: Insulin Pen Needle Use once daily   glimepiride 4 MG tablet Commonly known as: AMARYL Take 4 mg by mouth daily with breakfast.   guaiFENesin-dextromethorphan 100-10 MG/5ML syrup Commonly known as: ROBITUSSIN DM Take 10 mLs by mouth every 4 (four) hours as needed for cough.   [employer] TEST STRIPS test strip Generic drug: glucose blood 1 each by Other route 2 (two) times daily. E11.9   MEGARED OMEGA-3 KRILL OIL PO Take 1 capsule by mouth daily after breakfast.   multivitamin capsule Take 1 capsule by mouth daily.   NIFEdipine 90 MG 24 hr tablet Commonly known as: PROCARDIA XL/NIFEDICAL-XL Take 90 mg by mouth every morning.   polyethylene glycol 17 g packet Commonly known as: MIRALAX / GLYCOLAX Take 17 g by mouth daily as needed for mild constipation.   simvastatin 40 MG tablet Commonly known as: ZOCOR Take 40 mg by mouth at bedtime.   Tyler Aas FlexTouch 200 UNIT/ML Sopn Generic drug: Insulin Degludec Inject 38-40 Units into the skin daily before breakfast.   Vitamin D3 50 MCG (2000 UT) Tabs Take 2,000 Units by mouth.   zinc sulfate 220 (50 Zn) MG capsule Take 1 capsule (220 mg total) by mouth daily.       No Known Allergies  Consultations:  None  Procedures/Studies: DG CHEST PORT 1 VIEW  Result Date: 12/30/2019 CLINICAL DATA:  COVID-19 pneumonia. EXAM: PORTABLE CHEST 1 VIEW COMPARISON:  One-view chest x-ray/16/21. FINDINGS: Aeration of both lower lobes is improving. Persistent right upper lobe opacity is noted. Heart size is normal. There is no edema or effusion. IMPRESSION: 1. Improving aeration of both lower lobes. 2. Persistent right upper lobe airspace disease. Electronically Signed   By: San Morelle M.D.   On: 12/30/2019 09:41   DG CHEST PORT 1 VIEW  Result Date: 12/29/2019 CLINICAL DATA:  79 year old male with history of shortness of breath. COVID-19 infection. EXAM: PORTABLE CHEST 1 VIEW COMPARISON:  Chest x-ray 12/27/2019.  FINDINGS: Lung volumes remain slightly low, but have increased. There are persistent but improving patchy ill-defined opacities and areas of interstitial prominence throughout the mid to lower lungs bilaterally (right greater than left). No pleural effusions. No evidence of pulmonary edema. Heart size is borderline enlarged. Upper mediastinal contours are within normal limits. Aortic atherosclerosis. IMPRESSION: 1. Slight improvement in multilobar bilateral pneumonia, as above. 2. Aortic atherosclerosis. Electronically Signed   By: Vinnie Langton M.D.   On: 12/29/2019 10:32   DG Chest Port 1 View  Result Date: 12/27/2019 CLINICAL DATA:  79 year old male with COVID-19. EXAM: PORTABLE CHEST 1 VIEW COMPARISON:  Chest radiograph dated 12/27/2019. FINDINGS: There is shallow inspiration with minimal bibasilar atelectasis. Bilateral interstitial and streaky densities primarily involving the right  upper lung field most consistent with multifocal pneumonia, likely viral or atypical in etiology. Clinical correlation and follow-up recommended. No lobar consolidation, pleural effusion or pneumothorax. Stable cardiomediastinal silhouette. Atherosclerotic calcification of the aorta. No acute osseous pathology. IMPRESSION: Pneumonia.  Clinical correlation and follow-up recommended. Electronically Signed   By: Anner Crete M.D.   On: 12/27/2019 19:30    Subjective: Seen and examined at bedside and he was doing well.  Denies any chest pain, lightheadedness or dizziness.  No nausea or vomiting.  No shortness of breath.  Was weaned off of supplemental oxygen.  Denies any other concerns or complaints at this time is ready to go home.  Discharge Exam: Vitals:   12/30/19 2151 12/31/19 0743  BP: (!) 181/72 (!) 147/76  Pulse: 71 74  Resp: 20   Temp: 97.6 F (36.4 C) 98.9 F (37.2 C)  SpO2: 98% 97%   Vitals:   12/30/19 0804 12/30/19 1652 12/30/19 2151 12/31/19 0743  BP: (!) 151/70 (!) 142/79 (!) 181/72 (!) 147/76   Pulse: 72 74 71 74  Resp: '20 18 20   ' Temp:  (!) 97.1 F (36.2 C) 97.6 F (36.4 C) 98.9 F (37.2 C)  TempSrc:  Oral  Oral  SpO2: 97% 98% 98% 97%  Weight:      Height:       General: Pt is alert, awake, not in acute distress Cardiovascular: RRR, S1/S2 +, no rubs, no gallops Respiratory: Diminished to auscultation bilaterally with coarse breath sounds, no wheezing, no rhonchi; No supplemental O2 via [place] Abdominal: Soft, NT, ND, bowel sounds + Extremities: 1+ LE edema, no cyanosis  The results of significant diagnostics from this hospitalization (including imaging, microbiology, ancillary and laboratory) are listed below for reference.    Microbiology: Recent Results (from the past 240 hour(s))  Culture, blood (routine x 2)     Status: None (Preliminary result)   Collection Time: 12/28/19  9:39 AM   Specimen: BLOOD  Result Value Ref Range Status   Specimen Description BLOOD LEFT ANTECUBITAL  Final   Special Requests   Final    BOTTLES DRAWN AEROBIC AND ANAEROBIC Blood Culture adequate volume   Culture   Final    NO GROWTH 2 DAYS Performed at [facility], [address], [place], [place] [address]    Report Status PENDING  Incomplete  Culture, blood (routine x 2)     Status: None (Preliminary result)   Collection Time: 12/28/19 10:31 AM   Specimen: BLOOD LEFT ARM  Result Value Ref Range Status   Specimen Description BLOOD LEFT ARM  Final   Special Requests   Final    BOTTLES DRAWN AEROBIC ONLY Blood Culture adequate volume   Culture   Final    NO GROWTH 2 DAYS Performed at [facility], [address], [place], [place] [address]    Report Status PENDING  Incomplete    Labs: BNP (last 3 results) No results for input(s): BNP in the last 8760 hours. Basic Metabolic Panel: Recent Labs  Lab 12/27/19 1427 12/28/19 0337 12/29/19 0641 12/30/19 0624 12/31/19 0318  NA 137 138 140 141 140  K 4.4 4.4 4.7 4.1 4.6  CL 101 104 108 105 103  CO2 22 20* '22 25 27   ' GLUCOSE 157* 212* 161* 121* 118*  BUN 39* 45* 53* 53* 54*  CREATININE 2.92* 2.73* 2.42* 2.26* 2.25*  CALCIUM 9.1 8.5* 8.9 8.6* 8.9  MG  --  2.1 2.1 2.2 2.2  PHOS  --  3.9 3.0 3.7 3.5   Liver Function Tests: Recent Labs  Lab 12/27/19 1427 12/28/19 0337 12/29/19 0641 12/30/19 0624 12/31/19 0318  AST '31 25 28 28 30  ' ALT '25 22 26 26 ' 36  ALKPHOS 55 45 46 44 44  BILITOT 0.8 0.6 0.4 0.8 0.5  PROT 6.7 5.8* 6.5 6.3* 6.3*  ALBUMIN 3.1* 2.8* 3.2* 3.1* 3.1*   No results for input(s): LIPASE, AMYLASE in the last 168 hours. No results for input(s): AMMONIA in the last 168 hours. CBC: Recent Labs  Lab 12/27/19 1427 12/28/19 0337 12/29/19 0641 12/30/19 0624 12/31/19 0318  WBC 7.9 4.7 5.4 5.9 8.2  NEUTROABS  --  3.8 4.1 4.1 4.9  HGB 11.1* 9.9* 10.4* 10.4* 11.2*  HCT 35.1* 29.6* 32.0* 31.7* 33.1*  MCV 92.1 87.8 88.4 88.5 87.1  PLT 231 214 297 323 355   Cardiac Enzymes: No results for input(s): CKTOTAL, CKMB, CKMBINDEX, TROPONINI in the last 168 hours. BNP: Invalid input(s): POCBNP CBG: Recent Labs  Lab 12/30/19 0806 12/30/19 1127 12/30/19 1652 12/30/19 2149 12/31/19 0741  GLUCAP 85 132* 281* 199* 57*   D-Dimer Recent Labs    12/30/19 0624 12/31/19 0318  DDIMER 1.18* 0.79*   Hgb A1c No results for input(s): HGBA1C in the last 72 hours. Lipid Profile No results for input(s): CHOL, HDL, LDLCALC, TRIG, CHOLHDL, LDLDIRECT in the last 72 hours. Thyroid function studies No results for input(s): TSH, T4TOTAL, T3FREE, THYROIDAB in the last 72 hours.  Invalid input(s): FREET3 Anemia work up [employer]    12/29/19 0641 12/29/19 0641 12/30/19 0624 12/31/19 0318  VITAMINB12 325  --   --   --   FOLATE 31.6  --   --   --   FERRITIN 288   < > 274 251  TIBC 255  --   --   --   IRON 41*  --   --   --   RETICCTPCT 0.6  --   --   --    < > = values in this interval not displayed.   Urinalysis No results found for: COLORURINE, APPEARANCEUR, [place], [place], [place],  [place], [place], [place], PROTEINUR, UROBILINOGEN, NITRITE, LEUKOCYTESUR Sepsis Labs Invalid input(s): PROCALCITONIN,  WBC,  LACTICIDVEN Microbiology Recent Results (from the past 240 hour(s))  Culture, blood (routine x 2)     Status: None (Preliminary result)   Collection Time: 12/28/19  9:39 AM   Specimen: BLOOD  Result Value Ref Range Status   Specimen Description BLOOD LEFT ANTECUBITAL  Final   Special Requests   Final    BOTTLES DRAWN AEROBIC AND ANAEROBIC Blood Culture adequate volume   Culture   Final    NO GROWTH 2 DAYS Performed at [facility], [address], [place], [place] [address]    Report Status PENDING  Incomplete  Culture, blood (routine x 2)     Status: None (Preliminary result)   Collection Time: 12/28/19 10:31 AM   Specimen: BLOOD LEFT ARM  Result Value Ref Range Status   Specimen Description BLOOD LEFT ARM  Final   Special Requests   Final    BOTTLES DRAWN AEROBIC ONLY Blood Culture adequate volume   Culture   Final    NO GROWTH 2 DAYS Performed at [facility], [address], [place], [place] [address]    Report Status PENDING  Incomplete   Time coordinating discharge: 35 minutes  SIGNED:  Kerney Elbe, DO [facility] 12/31/2019, 7:53 AM Pager is on  AMION  If 7PM-7AM, please contact night-coverage [email or web address] Password [email or web address]

## 2020-01-02 LAB — CULTURE, BLOOD (ROUTINE X 2)
Culture: NO GROWTH
Culture: NO GROWTH
Special Requests: ADEQUATE
Special Requests: ADEQUATE

## 2020-01-11 ENCOUNTER — Encounter: Payer: Self-pay | Admitting: *Deleted

## 2020-01-11 ENCOUNTER — Other Ambulatory Visit: Payer: Self-pay | Admitting: *Deleted

## 2020-01-11 NOTE — Patient Outreach (Signed)
[facility]) Care Management Chronic Special Needs Program  01/11/2020  Name: Azarias Chiou DOB: 02-Apr-1941  MRN: [id number]  Mr. Spiros Greenfeld is enrolled in a chronic special needs plan for Diabetes. Chronic Care Management Coordinator telephoned client to review health risk assessment and to develop individualized care plan.  Introduced the chronic care management program, importance of client participation, and taking their care plan to all provider appointments and inpatient facilities.  Reviewed the transition of care process and possible referral to community care management. Mr Micco was hospitalized at [facility] from 1/14-1/18/21 for Covid 19 infection with pneumonia. He initially tested positive for Covid on 12/24/19.  Subjective: Reached Mr. Mohabir via home phone, he asked that I speak to his wife as he is hard of hearing and has a difficult time understanding people on the phone despite bilateral hearing aids.  Mrs. Barsky says her husband is feeling much better but is still fatigued. She says he has regained his sense of smell and taste. She says he never had a cough with the viral infection and that he has not had to use his rescue inhaler since discharge from the hospital. She says she is checking his blood pressure, and oxygen saturation daily and reports readings of 123/66 and 95% respectively today.  She says he checks his blood sugar 3-5 times weekly and reports his fasting blood sugar as 137 two days ago.  She says they live ina tri level home that they own, have 3 adult children, several grandchildren and great grandchildren. 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 He does yard work Stage manager) and does significant amounts of walking when he is working.  She says since she and her husband both had Covid, they will receive the vaccine when it is  available for them and after the designated time frame from recovering from Covid.   Goals Addressed            This Visit's Progress     Patient Stated   . Fully recover from Covid (pt-stated)       Assessed current status of recovery form Covid 19 infection with pneumonia Mailed the following Emmi education modules to client: Tips to Help You Cope in Uncertain Times, Covid 19 Overview, Recovery after Covid 19, Covid 19 Vaccines      Other   . Client understands the importance of follow-up with providers by attending scheduled visits   On track    Reviewed timing of follow up appointments with provider; reviewed completed appointments in electronic medical record- indicates client is consistent with keeping provider appointments    . Client will verbalize knowledge of self management of Hypertension as evidences by BP reading of 140/90 or less; or as defined by provider   On track    Wife reports she takes client's blood pressure, temperature, pulse and room air oxygen saturation reading everyday. She reports today's blood pressure reading as 123/66 Review of provider office visit readings indicate blood pressure runs slightly higher than target of <140/<90    . General - Client will not be readmitted within 30 days (C-SNP)       Reviewed discharge instructions with client's wife Ensured client has follow up appointment with his primary care provider See transition of care template for items reviewed with wife    . HEMOGLOBIN A1C < 7.0  Hgb A1C= 8.2% on 12/27/19 while hospitalized Hgb A1C = 7.4% on 06/26/19  Diabetes self management actions reviewed with wife:  Glucose monitoring per provider recommendations  Perform Quality checks on blood meter when doubtful of readings  Eat Healthy  Check feet daily  Visit provider every 3-6 months as directed  Hbg A1C level every 3-6 months.  Eye Exam yearly     . Maintain timely refills of diabetic medication as prescribed  within the year .   On track    Assessed client's medication taking behavior Review of medication dispense report indicates client refills diabetes medications in a timely manner     . Obtain annual  Lipid Profile, LDL-C   On track    06/26/19 lipid profile completed, LDL= 108, he is on statin and fenofibrate and omega 3 fish oil    . Obtain Annual Eye (retinal)  Exam    On track    Per record review in Care Everywhere- diabetic eye exam completed on 03/14/19    . Obtain Annual Foot Exam   On track    Per record review in Care Everywhere- diabetic foot exam completed on 12/22/18    . Obtain annual screen for micro albuminuria (urine) , nephropathy (kidney problems)   On track    Has known chronic kidney disease, microalbuminuria completed on 06/26/19 with large amounts of protein in urine, wife states client sees nephrologist yearly as instructed GFR too low for consideration of SGLT2 inhibitor for renal protection    . Obtain Hemoglobin A1C at least 2 times per year   On track    Hgb A1C Completed on 02/06/19, 06/26/19, 12/27/19 with results 7.1%, 7.4% and 8.2% respectively    . Visit Primary Care Provider or Endocrinologist at least 2 times per year    On track    Client completed appointment with primary care provider on 02/06/19, and 06/26/19     Assessment: Client with uneventful recovery from Covid infection with pneumonia.  Client is not meeting diabetes self-management goal of hemoglobin A1C of <7% with most recent reading of  8.2 % on 12/27/19 without reports of hypoglycemia . Mrs. Dobrowski says she and her husband have a good understanding of hospital discharge instructions in regards to recovering from Covid 19 infection and precautions to stay safe from reinfection.and symptoms requiring provider notification.   Plan:   Send successful outreach letter with a copy of their individualized care plan to client Send individual care plan to provider  Send Emmi educational material on:  Covid- Overview and Covid Vaccines and Recovery From Covid, Tips to Help You Cope in Uncertain Times and Diabetes and Blood Pressure Chronic care management coordination will outreach in:  3 weeks ( one month post hospital discharge) to assess recovery progress   Kelli Churn RN, CCM, [place] Management Coordinator [place] Management [phone]

## 2020-01-14 ENCOUNTER — Other Ambulatory Visit: Payer: Self-pay | Admitting: *Deleted

## 2020-01-14 NOTE — Patient Outreach (Signed)
  [facility]) Care Management Chronic Special Needs Program    01/14/2020  Name: Elvie Maines, DOB: 1941-11-30  MRN: [id number]   Mr. Bow Buntyn is enrolled in a chronic special needs plan for Diabetes. At client's previous direction, called Mrs. Vanderpol and left message on her cell phone with the phone number for [facility] Department and [place] 19 vaccination waiting list so that client and wife can place name on the list starting tomorrow.  Kelli Churn RN, CCM, [place] Management Coordinator [facility] Care Management [phone]

## 2020-01-31 ENCOUNTER — Other Ambulatory Visit: Payer: Self-pay | Admitting: *Deleted

## 2020-01-31 ENCOUNTER — Ambulatory Visit: Payer: Self-pay | Admitting: *Deleted

## 2020-01-31 ENCOUNTER — Ambulatory Visit: Payer: HMO | Admitting: *Deleted

## 2020-01-31 NOTE — Patient Outreach (Signed)
  [facility]) Care Management Chronic Special Needs Program    01/31/2020  Name: Isaac Stewart, DOB: [birth year]  MRN: [id number]   Mr. Isaac Stewart is enrolled in a chronic special needs plan for Diabetes. Reached client via cell phone and purpose of call explained. He requested this RNCM call back tomorrow afternoon.  Plan: Will call client tomorrow afternoon to complete one month post hospital discharge assessment.  Kelli Churn RN, CCM, [place] Management Coordinator [facility] Care Management [phone]

## 2020-02-01 ENCOUNTER — Other Ambulatory Visit: Payer: Self-pay | Admitting: *Deleted

## 2020-02-01 ENCOUNTER — Encounter: Payer: Self-pay | Admitting: *Deleted

## 2020-02-01 NOTE — Patient Outreach (Signed)
[facility]) Care Management Chronic Special Needs Program  02/01/2020  Name: Isaac Stewart DOB: 01-Apr-1941  MRN: [id number]  Mr. Isaac Stewart is enrolled in a chronic special needs plan for Diabetes. Reviewed and updated care plan. Mr Isaac Stewart was hospitalized at [facility] from 1/14-1/18/21 for Covid 19 infection with pneumonia. He initially tested positive for Covid on 12/24/19.  Subjective: Spoke with client and his wife Isaac Stewart ( at client's direction) to assess clinical status one month post hospital discharge from Covid infection with pneumonia. Both state he is doing well, no residual effects form the virus. Isaac Stewart also states client saw his nephrologist today and received a good report that all labs were good.  Isaac Stewart says their daughter is a Marine scientist at [employer] and she has suggested they wait to receive the Wynetta Emery and [employer] vaccine.   Goals Addressed            This Visit's Progress     Patient Stated   . COMPLETED: "Fully recover from Covid" (pt-stated)   On track    01/11/20 Assessed current status of recovery form Covid 19 infection with pneumonia Mailed the following Emmi education modules to client: Tips to Help You Cope in Uncertain Times, Covid 19 Overview, Recovery after Covid 19, Covid 19 Vaccines 02/01/20 Client states he has fully recovered from Covid and has no residual effects      Other   . COMPLETED: General - Client will not be readmitted within 30 days (C-SNP)   On track    Reviewed discharge instructions with client's wife Ensured client has follow up appointment with his primary care provider See transition of care template for items reviewed with wife 02/01/20 Client states he has recovered fully from Covid and has not required readmit to hospital    . HEMOGLOBIN A1C < 7.0       Hgb A1C= 8.2% on 12/27/19 while hospitalized Hgb A1C = 7.4% on 06/26/19  Reviewed diabetes self management actions reviewed with  wife:  Glucose monitoring per provider recommendations  Perform Quality checks on blood meter when doubtful of readings  Eat Healthy  Check feet daily  Visit provider every 3-6 months as directed  Hbg A1C level every 3-6 months.  Eye Exam yearly     . Maintain timely refills of diabetic medication as prescribed within the year .   On track    Assessed client's medication taking behavior Review of medication dispense report indicates client refills diabetes medications in a timely manner     . Obtain annual  Lipid Profile, LDL-C   On track    06/26/19 lipid profile completed, LDL= 108, he is on statin and fenofibrate and omega 3 fish oil    . Obtain Annual Eye (retinal)  Exam    On track    Per KPN (Knowledge Performance Now- point of care tool) - diabetic eye exam was completed on 07/26/19    . Obtain Annual Foot Exam       Unable to find documented results of diabetic foot exam in Care Everywhere Client states his provider checks his feet at each visit    . 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  Obtain Hemoglobin A1C at least 2 times per year   On track    Hgb A1C Completed on 02/06/19, 06/26/19, 12/27/19 with results 7.1%, 7.4% and 8.2% respectively    . Visit Primary Care Provider or Endocrinologist at least 2 times per year    On track    Client completed appointments with primary care provider on 02/06/19, and 06/26/19 and 01/14/20     Assessment: Client with uneventful recovery from Covid infection with pneumonia.  Plan:   RNCM will contact client in 9-12 months per [place] of Care tier 1  level.   Kelli Churn RN, CCM, [place] Management Coordinator [facility] Care Management [phone]  .

## 2020-05-07 DIAGNOSIS — Z8616 Personal history of COVID-19: Secondary | ICD-10-CM | POA: Insufficient documentation

## 2020-10-28 ENCOUNTER — Other Ambulatory Visit: Payer: Self-pay | Admitting: *Deleted

## 2020-10-28 NOTE — Patient Outreach (Signed)
  [facility]) Care Management Chronic Special Needs Program    10/28/2020  Name: Torin Modica, DOB: Aug 14, 1941  MRN: [id number]   Mr. Armany Mano is enrolled in a chronic special needs plan for Diabetes.  Outreach call to client for telephone follow up assessment, spoke with client and he reports he is driving and this is not a good day to talk, requests call back another day.  PLAN Outreach client in 1-2 weeks  Jacqlyn Larsen [facility], BSN [place], [place]

## 2020-10-30 ENCOUNTER — Encounter: Payer: Self-pay | Admitting: *Deleted

## 2020-10-30 ENCOUNTER — Other Ambulatory Visit: Payer: Self-pay | Admitting: *Deleted

## 2020-10-30 NOTE — Patient Outreach (Signed)
[facility]) Care Management Chronic Special Needs Program  10/30/2020  Name: Casmer Yepiz DOB: 1941-06-25  MRN: [id number]  Mr. Terre Zabriskie is enrolled in a chronic special needs plan for Diabetes. Reviewed and updated care plan.  Subjective:  Client reports he lives with spouse at home and continues to be independent in all aspects of his care, continues to drive,  Client states he is a member at a gym and stopped going because of covid, plans to get booster vaccine and start going back to gym, has personal goal "to get more exercise"  Client reports he checks blood sugar once weekly and checks BP once weekly.  Health Risk Assessment completed with client. Client verbalizes understanding of importance of having yearly eye exam and making appointment for this.   Goals Addressed              This Visit's Progress   .  COMPLETED: "Fully recover from Covid" (pt-stated)      .  "To get more exercise" (pt-stated)        Discuss exercise with your primary care provider RN care manager provided education article "Exercise and Aging"  "Exercise and Movement"    .  Client understands the importance of follow-up with providers by attending scheduled visits   On track     Reviewed timing of follow up appointments with provider; reviewed completed appointments in electronic medical record- indicates client is consistent with keeping provider appointments      .  Client will verbalize knowledge of self management of Hypertension as evidences by BP reading of 140/90 or less; or as defined by provider        Plan to check blood pressure regularly.  If you do not have a B/P monitor (cuff), one can be provided to you.  Write results in your [employer] calendar (in the back section). Reviewed blood pressure medication from EMR. Take B/P medications as ordered.  Some may cause you to use the bathroom more. Plan to eat low salt and heart healthy meals full of fruits,  vegetables, whole grains, lean protein and limit fat and sugars. Increase activity as tolerated. Reviewed lifestyle modification-weight control and reducing stress. EMMI education article provided "High blood pressure in adults"  Review and plan to discuss with RN during next telephonic assessment.     .  COMPLETED: General - Client will not be readmitted within 30 days (C-SNP)      .  HEMOGLOBIN A1C < 7        Your last documented AIC is 8.2 on 12/27/19.  Have your [facility] checked every 6 months if you are at goal or every 3 months if you are not at goal. Check blood sugars daily before eating with goal of 80-130.  You can also check 1 1/2 hours after eating with goal of 180 or less. Plan to eat low carbohydrate and low salt meals, watch portion sizes and avoid sugar sweetened drinks.  Discussed carbohydrate control meals. Reviewed signs and symptoms of hyperglycemia (high blood sugar) and hypoglycemia (low blood sugar) and actions to take. Review [employer] calendar (sent in the mail) for diabetes action plan in the back. Reviewed nutrition counseling benefit provided by [employer].   Increase activity only if you are able to do it.  Follow doctor recommendations. EMMI education provided on "Diabetes and Diet".  Review and plan to discuss with RN during next telephonic assessment.  Use 24 hour nurse advice line  as needed at [phone].       .  Maintain timely refills of diabetic medication as prescribed within the year .        Contact your RN care manager if you have questions about medicines. Medication review completed from EMR information. It is important to take your medications as prescribed. Reviewed use and possible side effects of diabetes medications.       .  Obtain annual  Lipid Profile, LDL-C        Per medical record review, unable to determine when last lipid profile completed The goal for LDL is less than 70mg /dl as you are at high risk for  complications. Try to avoid saturated fats, trans-fats and eat more fiber. Plan to take statin (cholesterol) medicine as ordered.     .  Obtain Annual Eye (retinal)  Exam         Your last documented eye exam was on 07/26/19 Diabetes can affect your vision.  Plan to have a dilated eye exam every year. Advised client to keep and/ or schedule appointment with eye doctor.      .  Obtain Annual Foot Exam        Your doctor should check your bare feet at each visit. Diabetes can affect the nerves in your feet, causing decreased feeling or numbness. Check your feet and in-between toes daily for cuts, bruises, redness, blisters or sores.  If you cannot reach them, use a mirror. Wash feet with soap and water, dry feet well especially between toes.  Don't use too much lotion. Wear shoes that are not too tight and don't walk barefoot.     .  Obtain annual screen for micro albuminuria (urine) , nephropathy (kidney problems)        Diabetes can affect your kidneys. It is important for your doctor to check your urine at least once a year  These tests show how your kidneys are working.     .  Obtain Hemoglobin A1C at least 2 times per year        Hgb A1C Completed on 02/06/19, 06/26/19, 12/27/19 with results 7.1%, 7.4% and 8.2% respectively      .  Visit Primary Care Provider or Endocrinologist at least 2 times per year         Client completed appointments with primary care provider on 02/06/19, and 06/26/19 and 01/14/20, 05/27/20.       Plan:    RN care manager faxed today's note with updated individualized care plan to primary care provider, mailed updated individualized care plan to client along with successful outreach letter, education articles, 24 hour nurse line magnet.   Chronic care management coordinator will outreach in:  9-12 months  Kassie Mends Nursing/RN [place] Case Manager, C-SNP  [phone]  .

## 2020-12-18 ENCOUNTER — Other Ambulatory Visit: Payer: Self-pay | Admitting: *Deleted

## 2020-12-18 NOTE — Patient Outreach (Signed)
  [facility]) Care Management Chronic Special Needs Program    12/18/2020  Name: Isaac Stewart, DOB: 1941-11-01  MRN: [id number]   Mr. Isaac Stewart is enrolled in a chronic special needs plan for Diabetes.  [employer] care management team has assumed care and services for this member.  Case closed by [facility] care management.  Jacqlyn Larsen [facility], BSN [place], [place]

## 2021-01-26 ENCOUNTER — Encounter: Payer: Self-pay | Admitting: Gastroenterology

## 2021-04-17 ENCOUNTER — Other Ambulatory Visit: Payer: Self-pay

## 2021-04-17 ENCOUNTER — Encounter: Payer: Self-pay | Admitting: Cardiovascular Disease

## 2021-04-17 ENCOUNTER — Ambulatory Visit: Payer: HMO | Admitting: Cardiovascular Disease

## 2021-04-17 VITALS — BP 158/62 | HR 67 | Ht 72.0 in | Wt 226.4 lb

## 2021-04-17 DIAGNOSIS — R0989 Other specified symptoms and signs involving the circulatory and respiratory systems: Secondary | ICD-10-CM

## 2021-04-17 DIAGNOSIS — R0609 Other forms of dyspnea: Secondary | ICD-10-CM | POA: Insufficient documentation

## 2021-04-17 DIAGNOSIS — I10 Essential (primary) hypertension: Secondary | ICD-10-CM

## 2021-04-17 DIAGNOSIS — R0600 Dyspnea, unspecified: Secondary | ICD-10-CM | POA: Insufficient documentation

## 2021-04-17 DIAGNOSIS — E782 Mixed hyperlipidemia: Secondary | ICD-10-CM

## 2021-04-17 DIAGNOSIS — R011 Cardiac murmur, unspecified: Secondary | ICD-10-CM | POA: Diagnosis not present

## 2021-04-17 NOTE — Assessment & Plan Note (Addendum)
History of hyperlipidemia on statin therapy.  This is followed by his PCP.  Recent blood work performed by his PCP on 03/12/2021 revealed total cholesterol 167, LDL of 92 and HDL of 38.  These labs are acceptable for primary prevention.

## 2021-04-17 NOTE — Patient Instructions (Addendum)
Medication Instructions:   -Stop taking amlodipine.  *If you need a refill on your cardiac medications before your next appointment, please call your pharmacy*   Testing/Procedures: Your physician has requested that you have an echocardiogram. Echocardiography is a painless test that uses sound waves to create images of your heart. It provides your doctor with information about the size and shape of your heart and how well your heart's chambers and valves are working. This procedure takes approximately one hour. There are no restrictions for this procedure. This procedure is done at [address] [place] physician has requested that you have a carotid duplex. This test is an ultrasound of the carotid arteries in your neck. It looks at blood flow through these arteries that supply the brain with blood. Allow one hour for this exam. There are no restrictions or special instructions. This procedure is done at [place]. 2nd Floor     Follow-Up: At [facility], you and your health needs are our priority.  As part of our continuing mission to provide you with exceptional heart care, we have created designated Provider Care Teams.  These Care Teams include your primary Cardiologist (physician) and Advanced Practice Providers (APPs -  Physician Assistants and Nurse Practitioners) who all work together to provide you with the care you need, when you need it.  We recommend signing up for the patient portal called "MyChart".  Sign up information is provided on this After Visit Summary.  MyChart is used to connect with patients for Virtual Visits (Telemedicine).  Patients are able to view lab/test results, encounter notes, upcoming appointments, etc.  Non-urgent messages can be sent to your provider as well.   To learn more about what you can do with MyChart, go to [email or web address].    Your next appointment:   12 month(s)  The format for your next appointment:   In  Person  Provider:   Quay Burow, MD   Other Instructions Please keep a blood pressure log at home for 30 days. We will schedule you a visit with one of your PharmDs to review.

## 2021-04-17 NOTE — Progress Notes (Addendum)
04/17/2021 Isaac Stewart   Jan 28, 1941  [id number]  Primary Physician Drosinis, Pamalee Leyden, PA-C Primary Cardiologist: Lorretta Harp MD Lupe Carney, [place]  HPI:  Isaac Stewart is a 80 y.o. mildly overweight married Caucasian male father of 29, grandfather of 4 grandchildren who is wife Isaac Stewart is accompanying him today.  They were both patients of mine in the past but I have not seen him in many years.  They referred by his PCP for a recently auscultated murmur.  He is retired from being in Market researcher.  His risk factors otherwise are notable for treated hypertension, diabetes and hyperlipidemia.  There is no family history for heart disease.  He is never had a heart attack or stroke.  He does complain of some dyspnea more noticeable since he and his wife both had COVID 2 years ago.  He is fairly active and mows his own yard.  His PCP recently auscultated a murmur and referred him here for further evaluation.   Current Meds  Medication Sig  . albuterol (VENTOLIN HFA) 108 (90 Base) MCG/ACT inhaler Inhale 1-2 puffs into the lungs every 4 (four) hours as needed for wheezing or shortness of breath.  . APPLE CIDER VINEGAR PO Take 2 tablets by mouth daily with breakfast.   . ascorbic acid (VITAMIN C) 500 MG tablet Take 1 tablet (500 mg total) by mouth daily.  Marland Kitchen aspirin EC 81 MG tablet Take 81 mg by mouth at bedtime.  . Cholecalciferol (VITAMIN D3) 50 MCG (2000 UT) TABS Take 2,000 Units by mouth.  Marland Kitchen CINNAMON PO Take 2 capsules by mouth daily.   . cloNIDine (CATAPRES) 0.2 MG tablet Take 0.2 mg by mouth 2 (two) times daily.   . diphenhydrAMINE (BENADRYL) 25 MG tablet Take 50 mg by mouth every morning.   Marland Kitchen doxazosin (CARDURA) 4 MG tablet Take 4 mg by mouth daily.  . fenofibrate 160 MG tablet Take 160 mg by mouth every morning.   Marland Kitchen glimepiride (AMARYL) 4 MG tablet Take 4 mg by mouth daily with breakfast.   . glucose blood test strip 1 each by Other route 2 (two) times daily. E11.9  .  guaiFENesin-dextromethorphan (ROBITUSSIN DM) 100-10 MG/5ML syrup Take 10 mLs by mouth every 4 (four) hours as needed for cough.  . Insulin Pen Needle 32G X 4 MM MISC Use once daily  . MEGARED OMEGA-3 KRILL OIL PO Take 1 capsule by mouth daily after breakfast.  . Multiple Vitamin (MULTIVITAMIN) capsule Take 1 capsule by mouth daily.  Marland Kitchen NIFEdipine (PROCARDIA XL/NIFEDICAL-XL) 90 MG 24 hr tablet Take 90 mg by mouth every morning.   . polyethylene glycol (MIRALAX / GLYCOLAX) 17 g packet Take 17 g by mouth daily as needed for mild constipation.  . simvastatin (ZOCOR) 40 MG tablet Take 40 mg by mouth at bedtime.   . TRESIBA FLEXTOUCH 200 UNIT/ML SOPN Inject 56 Units into the skin daily before breakfast.   . zinc sulfate 220 (50 Zn) MG capsule Take 1 capsule (220 mg total) by mouth daily.  . [DISCONTINUED] amLODipine (NORVASC) 2.5 MG tablet Take 2.5 mg by mouth daily.     No Known Allergies  Social History   Socioeconomic History  . Marital status: Married    Spouse name: Not on file  . Number of children: 3  . Years of education: Not on file  . Highest education level: Not on file  Occupational History  . Occupation: semi retired Mining engineer  Tobacco Use  .  Smoking status: Former Smoker    Quit date: 08/06/2001    Years since quitting: 19.7  . Smokeless tobacco: Never Used  Vaping Use  . Vaping Use: Never used  Substance and Sexual Activity  . Alcohol use: Yes    Comment: occ social  . Drug use: Never  . Sexual activity: Not on file  Other Topics Concern  . Not on file  Social History Narrative   Lives with wife in [place] home that they own.   He is semi retired Mining engineer   3 adult children, 2 daughters- one lives in [place] and one lives in [place], one son- lives in [place]   Have grandchildren and great grandchildren that also live in the area   Social Determinants of Health   Financial Resource Strain: Not on file  Food Insecurity: No Food Insecurity   . Worried About Charity fundraiser in the Last Year: Never true  . Ran Out of Food in the Last Year: Never true  Transportation Needs: Not on file  Physical Activity: Not on file  Stress: Not on file  Social Connections: Not on file  Intimate Partner Violence: Not on file     Review of Systems: General: negative for chills, fever, night sweats or weight changes.  Cardiovascular: negative for chest pain, dyspnea on exertion, edema, orthopnea, palpitations, paroxysmal nocturnal dyspnea or shortness of breath Dermatological: negative for rash Respiratory: negative for cough or wheezing Urologic: negative for hematuria Abdominal: negative for nausea, vomiting, diarrhea, bright red blood per rectum, melena, or hematemesis Neurologic: negative for visual changes, syncope, or dizziness All other systems reviewed and are otherwise negative except as noted above.    Blood pressure (!) 158/62, pulse 67, height 6' (1.829 m), weight 226 lb 6.4 oz (102.7 kg), SpO2 97 %.  General appearance: alert and no distress Neck: no adenopathy, no JVD, supple, symmetrical, trachea midline, thyroid not enlarged, symmetric, no tenderness/mass/nodules and Bilateral carotid bruits Lungs: clear to auscultation bilaterally Heart: 2/6 apical systolic murmur consistent with mitral gravitation, soft outflow tract murmur Extremities: extremities normal, atraumatic, no cyanosis or edema Pulses: 2+ and symmetric Skin: Skin color, texture, turgor normal. No rashes or lesions Neurologic: Alert and oriented X 3, normal strength and tone. Normal symmetric reflexes. Normal coordination and gait  EKG sinus rhythm at 67 without ST or T wave changes.  I personally reviewed this EKG.  I would probably stop the amlodipine keep a blood pressure log for 30 days and have him see Kristen back in 3 days for follow-up  ASSESSMENT AND PLAN:   Hyperlipidemia History of hyperlipidemia on statin therapy.  This is followed by his PCP.   Recent blood work performed by his PCP on 03/12/2021 revealed total cholesterol 167, LDL of 92 and HDL of 38.  These labs are acceptable for primary prevention.  Hypertension, benign History of essential hypertension blood pressure measured today at 158/62.  He is on amlodipine, clonidine and Procardia.  Dyspnea on exertion History of dyspnea on exertion more noticeable since he had COVID 2 years ago.  He does have a soft apical systolic murmur.  I am going to get a 2D echo to further evaluate.      Lorretta Harp MD [place], [facility] 04/17/2021 4:29 PM

## 2021-04-17 NOTE — Assessment & Plan Note (Signed)
History of dyspnea on exertion more noticeable since he had COVID 2 years ago.  He does have a soft apical systolic murmur.  I am going to get a 2D echo to further evaluate.

## 2021-04-17 NOTE — Assessment & Plan Note (Signed)
History of essential hypertension blood pressure measured today at 158/62.  He is on amlodipine, clonidine and Procardia.

## 2021-04-23 ENCOUNTER — Ambulatory Visit (HOSPITAL_COMMUNITY)
Admission: RE | Admit: 2021-04-23 | Discharge: 2021-04-23 | Disposition: A | Discharge status: Home / Self Care | Payer: HMO | Source: Ambulatory Visit | Attending: Cardiology | Admitting: Cardiology

## 2021-04-23 ENCOUNTER — Other Ambulatory Visit: Payer: Self-pay

## 2021-04-23 DIAGNOSIS — R0989 Other specified symptoms and signs involving the circulatory and respiratory systems: Secondary | ICD-10-CM | POA: Diagnosis not present

## 2021-05-14 ENCOUNTER — Ambulatory Visit: Payer: HMO

## 2021-05-15 ENCOUNTER — Other Ambulatory Visit: Payer: Self-pay

## 2021-05-15 ENCOUNTER — Ambulatory Visit (HOSPITAL_COMMUNITY): Payer: HMO | Attending: Cardiology

## 2021-05-15 DIAGNOSIS — R011 Cardiac murmur, unspecified: Secondary | ICD-10-CM | POA: Insufficient documentation

## 2021-05-15 LAB — ECHOCARDIOGRAM COMPLETE
AR max vel: 1.72 cm2
AV Area VTI: 1.79 cm2
AV Area mean vel: 1.66 cm2
AV Mean grad: 15 mmHg
AV Peak grad: 27.9 mmHg
Ao pk vel: 2.64 m/s
Area-P 1/2: 2.76 cm2
S' Lateral: 3.4 cm

## 2021-05-17 NOTE — Progress Notes (Signed)
Patient ID: Isaac Stewart                 DOB: 09-Apr-1941                      MRN: [id number]     HPI: Isaac Stewart is a 80 y.o. male referred by Dr. Gwenlyn Found to HTN clinic. PMH significant for hypertension, diabetes, hyperlipidemia.   Last seen by Dr. Gwenlyn Found 04/17/21 to re-establish care for recently auscultated murmur by PCP. Echo showed normal LV systolic function, grade 1 diastolic dysfunction, mild aortic stenosis. BP was 158/62. Amlodipine 2.5 mg daily was discontinued (duplicate calcium channel blocker) and patient instructed to monitor BP log for 1 month.   Today, patient presents in good spirits, accompanied by his wife. Reports doing well, no changes noticed since stopping amlodipine. BP in office was 150/78. Brings log of 35 readings from home. Uses a wrist cuff, confirmed proper use. Also has an arm cuff at home. Uses a pill box for his meds and reports no missed doses. He has taken his medications today. Denies dizziness, headaches, blurred vision, swelling. Reports active lifestyle. Also sees an endocrinologist (Dr. Posey Pronto - first visit this Thursday) and a nephrologist, Dr. Clover Mealy.    Current HTN meds: Clonidine 0.2 mg BID (9:30am & 7pm), doxazosin 4 mg daily (7pm), nifedipine 90 mg every morning (9:30am) Previously tried: Amlodipine 5 mg, 2.5 mg BP goal: <130/80  Family History: No family hx heart disease.   Social History: Former smoker (quit 2002)  Diet:  -1 cup of coffee in the morning -No added salt except a little to watermelon or tomato  Exercise:  -Push mows front yard, maintenance on vehicles, working around the house, camps/fishes  Home BP readings: 35 readings Ranges 127/71 to 148/74, average 135/72  Wt Readings from Last 3 Encounters:  04/17/21 226 lb 6.4 oz (102.7 kg)  12/28/19 216 lb 14.9 oz (98.4 kg)  02/17/19 223 lb 15.8 oz (101.6 kg)   BP Readings from Last 3 Encounters:  04/17/21 (!) 158/62  12/31/19 (!) 147/76  02/17/19 132/65   Pulse Readings  from Last 3 Encounters:  04/17/21 67  12/31/19 74  02/17/19 64    Renal function: CrCl cannot be calculated (Patient's most recent lab result is older than the maximum 21 days allowed.).  Past Medical History:  Diagnosis Date  . Allergy   . 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 COVID-19 virus infection 12/24/2019  . Diabetes ([place])    oral medications type 2  . HTN (hypertension)   . Hyperlipemia     Current Outpatient Medications on File Prior to Visit  Medication Sig Dispense Refill  . albuterol (VENTOLIN HFA) 108 (90 Base) MCG/ACT inhaler Inhale 1-2 puffs into the lungs every 4 (four) hours as needed for wheezing or shortness of breath. 18 g 0  . APPLE CIDER VINEGAR PO Take 2 tablets by mouth daily with breakfast.     . ascorbic acid (VITAMIN C) 500 MG tablet Take 1 tablet (500 mg total) by mouth daily. 14 tablet 0  . aspirin EC 81 MG tablet Take 81 mg by mouth at bedtime.    . Cholecalciferol (VITAMIN D3) 50 MCG (2000 UT) TABS Take 2,000 Units by mouth.    Marland Kitchen CINNAMON PO Take 2 capsules by mouth daily.     . cloNIDine (CATAPRES) 0.2 MG tablet Take 0.2 mg by mouth 2 (two) times daily.     . diphenhydrAMINE (  BENADRYL) 25 MG tablet Take 50 mg by mouth every morning.     Marland Kitchen doxazosin (CARDURA) 4 MG tablet Take 4 mg by mouth daily.    . fenofibrate 160 MG tablet Take 160 mg by mouth every morning.     Marland Kitchen glimepiride (AMARYL) 4 MG tablet Take 4 mg by mouth daily with breakfast.     . glucose blood test strip 1 each by Other route 2 (two) times daily. E11.9    . guaiFENesin-dextromethorphan (ROBITUSSIN DM) 100-10 MG/5ML syrup Take 10 mLs by mouth every 4 (four) hours as needed for cough. 118 mL 0  . Insulin Pen Needle 32G X 4 MM MISC Use once daily    . MEGARED OMEGA-3 KRILL OIL PO Take 1 capsule by mouth daily after breakfast.    . Multiple Vitamin (MULTIVITAMIN) capsule Take 1 capsule by mouth daily.    Marland Kitchen NIFEdipine (PROCARDIA XL/NIFEDICAL-XL) 90 MG 24 hr tablet  Take 90 mg by mouth every morning.     . polyethylene glycol (MIRALAX / GLYCOLAX) 17 g packet Take 17 g by mouth daily as needed for mild constipation. 14 each 0  . simvastatin (ZOCOR) 40 MG tablet Take 40 mg by mouth at bedtime.     . TRESIBA FLEXTOUCH 200 UNIT/ML SOPN Inject 56 Units into the skin daily before breakfast.     . zinc sulfate 220 (50 Zn) MG capsule Take 1 capsule (220 mg total) by mouth daily. 14 capsule 0   No current facility-administered medications on file prior to visit.    No Known Allergies  Blood pressure (!) 150/78, pulse 80, resp. rate 17, height 6' (1.829 m), weight 226 lb 12.8 oz (102.9 kg), SpO2 95 %.  Hypertension, benign Blood pressure of 150/78 in office today is above goal <130/80 mmHg. Average home blood pressure of 135/71 is also above goal. Some fluctuation in home blood pressures. Discussed increasing frequency of clonidine to provide better coverage throughout the day or switching to clonidine patch. Will increase frequency of clonidine to 0.2 mg three times daily per patient preference. Continue current doses of doxazosin and nifedipine. Encouraged patient to continue exercising and low sodium diet. Asked patient to bring blood pressure cuffs to next visit. Recommend initiating low dose ACE inhibitor or ARB given elevated urine albumin creatine ratio >1000 to protect kidneys from further decline - asked patient to discuss with endocrinologist or nephrologist. Will follow up at next visit if not started. Follow up visit in 1 month.    Rebbeca Paul, PharmD PGY1 Pharmacy Resident 05/18/2021 12:17 PM  Please check [email or web address] for unit-specific pharmacy phone numbers.

## 2021-05-18 ENCOUNTER — Other Ambulatory Visit: Payer: Self-pay

## 2021-05-18 ENCOUNTER — Other Ambulatory Visit: Payer: Self-pay | Admitting: *Deleted

## 2021-05-18 ENCOUNTER — Ambulatory Visit (INDEPENDENT_AMBULATORY_CARE_PROVIDER_SITE_OTHER): Payer: HMO | Admitting: Pharmacist

## 2021-05-18 DIAGNOSIS — I10 Essential (primary) hypertension: Secondary | ICD-10-CM | POA: Diagnosis not present

## 2021-05-18 DIAGNOSIS — I350 Nonrheumatic aortic (valve) stenosis: Secondary | ICD-10-CM

## 2021-05-18 MED ORDER — CLONIDINE HCL 0.2 MG PO TABS: 0.2000 mg | ORAL_TABLET | Freq: Three times a day (TID) | ORAL | 1 refills | Status: DC

## 2021-05-18 NOTE — Assessment & Plan Note (Signed)
Blood pressure of 150/78 in office today is above goal <130/80 mmHg. Average home blood pressure of 135/71 is also above goal. Some fluctuation in home blood pressures. Discussed increasing frequency of clonidine to provide better coverage throughout the day or switching to clonidine patch. Will increase frequency of clonidine to 0.2 mg three times daily per patient preference. Continue current doses of doxazosin and nifedipine. Encouraged patient to continue exercising and low sodium diet. Asked patient to bring blood pressure cuffs to next visit. Recommend initiating low dose ACE inhibitor or ARB given elevated urine albumin creatine ratio >1000 to protect kidneys from further decline - asked patient to discuss with endocrinologist or nephrologist. Will follow up at next visit if not started. Follow up visit in 1 month.

## 2021-05-18 NOTE — Patient Instructions (Addendum)
It was nice to see you today!  Your goal blood pressure is less than 130/80 mmHg. In clinic, your blood pressure was 150/78 mmHg.  Medication Changes: Increase clonidine to 0.2 mg THREE times daily. This prescription has been sent to [facility].   Continue nifedipine 90 mg in the morning and doxazosin 4 mg in the evening  Please discuss with your endocrinologist or nephrologist regarding initiating a low dose ACE inhibitor or ARB to protect your kidneys.   Please bring your blood pressure cuffs with you to your next visit.   Monitor blood pressure at home daily and keep a log (on your phone or piece of paper) to bring with you to your next visit. Write down date, time, blood pressure and pulse.  Keep up the good work with diet and exercise. Aim for a diet full of vegetables, fruit and lean meats (chicken, [place], fish). Try to limit salt intake by eating fresh or frozen vegetables (instead of canned), rinse canned vegetables prior to cooking and do not add any additional salt to meals.   Follow up visit in 1 month.

## 2021-06-26 ENCOUNTER — Ambulatory Visit: Payer: HMO

## 2021-07-14 ENCOUNTER — Other Ambulatory Visit: Payer: Self-pay

## 2021-07-14 ENCOUNTER — Ambulatory Visit (INDEPENDENT_AMBULATORY_CARE_PROVIDER_SITE_OTHER): Payer: HMO | Admitting: Pharmacist

## 2021-07-14 VITALS — BP 152/82 | HR 74 | Resp 14 | Ht 72.0 in | Wt 224.2 lb

## 2021-07-14 DIAGNOSIS — I10 Essential (primary) hypertension: Secondary | ICD-10-CM | POA: Diagnosis not present

## 2021-07-14 NOTE — Patient Instructions (Signed)
It was nice meeting you today!  We would like your blood pressure to be less than 130/80  Since your home readings are all very close to goal, we will not make any changes today.  Continue to check your readings at home and if they begin to trend up please let us know and we can consider adding that beta blocker  Continue your clonidine 0.2mg  three times a day Continue your nifedipine XL 90mg  daily Continue your dosazosin 4mg  daily  Continue your exercise regimen.  Try for at least 30 minutes a day at least 5 days a week Continue to watch your salt intake  Please call with any questions!  Karren Cobble, PharmD, BCACP, [place], [place] [address], [place], [place] [address] Phone: [phone]; Fax: [phone] 07/14/2021 12:02 PM

## 2021-07-14 NOTE — Progress Notes (Signed)
Patient ID: Isaac Stewart                 DOB: 04-Jun-1941                      MRN: [id number]     HPI: Isaac Stewart is a 80 y.o. male referred by Dr. Gwenlyn Found to HTN clinic. PMH is significant for HTN, HLD, DM (A1c 7.7), CKD and history of Covid 19.  At last visit with HTN clinic patient was started on clonidine 0.2mg  TID due to patient's renal function.  Scr >2.  Has appointment with nephrologist later this month.  Patient presents today with wife in good spirits.  Uses Omron wrist BP cuff which was verified in room to be accurate.  Took readings up to 7/19 but stopped when he ran out of spaces to write readings.  7/19: 134/70, 128/68 7/18: 121/62 7/17: 129/68, 131/69 7/16: 131/70, 136/70 7/15: 132/72, 130/72 7/14: 128/62, 129/67 7/13: 124/64, 128/66 7/12: 130/71, 121/61  Is active with his wife.  Walks daily for about 45 minutes.  Estimates it is a mile.  Uses a push mower.  Drinks 1 cup of coffee in the morning. Does not add salt to food.  Current HTN meds: nifedipine XL 90 daily, clonidine 0.2mg  TID, doxazosin 4mg  daily Previously tried: Amlodipine BP goal: <130/80  Exercise: walks for 45 minutes, about a mile  Wt Readings from Last 3 Encounters:  05/18/21 226 lb 12.8 oz (102.9 kg)  04/17/21 226 lb 6.4 oz (102.7 kg)  12/28/19 216 lb 14.9 oz (98.4 kg)   BP Readings from Last 3 Encounters:  05/18/21 (!) 150/78  04/17/21 (!) 158/62  12/31/19 (!) 147/76   Pulse Readings from Last 3 Encounters:  05/18/21 80  04/17/21 67  12/31/19 74    Renal function: CrCl cannot be calculated (Patient's most recent lab result is older than the maximum 21 days allowed.).  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      aspirin EC 81 MG tablet Take 81 mg by mouth at bedtime.     CINNAMON PO Take 2 capsules by mouth daily.      cloNIDine (CATAPRES) 0.2 MG tablet Take 1 tablet (0.2 mg total) by mouth 3 (three) times daily. 90 tablet 1   diphenhydrAMINE (BENADRYL) 25 MG tablet Take 50 mg by mouth every morning.      doxazosin (CARDURA) 4 MG tablet Take 4 mg by mouth daily.     fenofibrate 160 MG tablet Take 160 mg by mouth every morning.      glimepiride (AMARYL) 4 MG tablet Take 4 mg by mouth daily with breakfast.      glucose blood test strip 1 each by Other route 2 (two) times daily. E11.9     Insulin Pen Needle 32G X 4 MM MISC Use once daily     Misc Natural Products (ELDERBERRY ZINC/VIT C/IMMUNE MT) Use as directed in the mouth or throat.     Multiple Vitamin (MULTIVITAMIN) capsule Take 1 capsule by mouth daily.     NIFEdipine (PROCARDIA XL/NIFEDICAL-XL)  90 MG 24 hr tablet Take 90 mg by mouth every morning.      simvastatin (ZOCOR) 40 MG tablet Take 40 mg by mouth at bedtime.      TRESIBA FLEXTOUCH 200 UNIT/ML SOPN Inject 56 Units into the skin daily before breakfast.      No current facility-administered medications on file prior to visit.    No Known Allergies   Assessment/Plan:  1. Hypertension -  Patient BP in room 148/76 which is above goal of <130/80 however majority of home readings near or at goal. Due to patient's renal function, hesitant to add ACEi/ARB.  Could consider starting beta blocker.  However recommended patient continue to monitor BP at home and will await assessment by nephrology before making decision. Patient and wife are in agreement.  Continue clonidine 0.2mg  TID Contineu nifedipine XL 90mg  daily Continue doxazosin 4mg  daily Recheck in 6-8 weeks after nephrology visit  Karren Cobble, PharmD, BCACP, [place], [place]. [address] [place], [place] [address] Phone:  [phone]; Fax: [phone] 07/14/2021 5:29 PM

## 2021-09-08 ENCOUNTER — Ambulatory Visit: Payer: HMO

## 2021-09-11 ENCOUNTER — Other Ambulatory Visit: Payer: Self-pay

## 2021-09-11 ENCOUNTER — Ambulatory Visit: Payer: HMO | Admitting: Pharmacist

## 2021-09-11 VITALS — BP 156/70 | HR 97

## 2021-09-11 DIAGNOSIS — I10 Essential (primary) hypertension: Secondary | ICD-10-CM

## 2021-09-11 MED ORDER — CLONIDINE HCL 0.2 MG PO TABS: 0.2000 mg | ORAL_TABLET | Freq: Three times a day (TID) | ORAL | 1 refills | Status: DC

## 2021-09-11 NOTE — Patient Instructions (Addendum)
It was good seeing you again today  We would like to keep your blood pressure less than 130/80  I refilled your clonidine for you today  Continue to check your blood pressure at home.  If it remains elevated please give [place] a call  Continue clonidine 0.2 mg three times daily Continue doxazosin 4mg  daily Continue nifedipine 90mg  daily  We will see you back in 3 months  Karren Cobble, PharmD, BCACP, CDCES, [place] [address], [place], [place] [address] Phone: [phone]; Fax: [phone] 09/11/2021 10:01 AM

## 2021-09-11 NOTE — Progress Notes (Signed)
Patient ID: [name]                 DOB: 10-25-1941                      MRN: [id number]     HPI: Isaac Stewart is a 80 y.o. male referred by Dr. Gwenlyn Found to HTN clinic. PMH is significant forHTN, HLD, DM (A1c 7.7), CKD and history of Covid 19.  At last visit with HTN clinic patient was started on clonidine 0.2mg  TID due to patient's renal function.  Scr >2.  Had appointment with nephrologist earlier this month but unfortunately can not see or find records.  Patient and wife present today in good spirits.  Patient ran out of clonidine 4 days ago and just came from [place] where he took his morning blood pressure medications.  Just got back from a fishing trip.  Forgot BP log but reports home readings have been in the 120s/60s-70s.  Last night was >150 but he had been out of his clonidine. Reports that he will sometimes forget his midday clonidine dose and was wondering if he could take 1.5 tablets in morning and evening.  Current HTN meds:  Clonidine 0.2mg  TID Doxazosin 4mg  daily Nifedipine 90mg  daily   BP goal: <130/80   Wt Readings from Last 3 Encounters:  07/14/21 224 lb 3.2 oz (101.7 kg)  05/18/21 226 lb 12.8 oz (102.9 kg)  04/17/21 226 lb 6.4 oz (102.7 kg)   BP Readings from Last 3 Encounters:  07/14/21 (!) 152/82  05/18/21 (!) 150/78  04/17/21 (!) 158/62   Pulse Readings from Last 3 Encounters:  07/14/21 74  05/18/21 80  04/17/21 67    Renal function: CrCl cannot be calculated (Patient's most recent lab result is older than the maximum 21 days allowed.).  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      aspirin EC  81 MG tablet Take 81 mg by mouth at bedtime.     CINNAMON PO Take 2 capsules by mouth daily.      cloNIDine (CATAPRES) 0.2 MG tablet Take 1 tablet (0.2 mg total) by mouth 3 (three) times daily. 90 tablet 1   diphenhydrAMINE (BENADRYL) 25 MG tablet Take 50 mg by mouth every morning.      doxazosin (CARDURA) 4 MG tablet Take 4 mg by mouth daily.     fenofibrate 160 MG tablet Take 160 mg by mouth every morning.      glimepiride (AMARYL) 4 MG tablet Take 4 mg by mouth daily with breakfast. Take 1 tablet in am and 0.5 tablet at night     glucose blood test strip 1 each by Other route 2 (two) times daily. E11.9     Insulin Pen Needle 32G X 4 MM MISC Use once daily     Multiple Vitamin (MULTIVITAMIN) capsule Take 1 capsule by mouth daily.     NIFEdipine (PROCARDIA XL/NIFEDICAL-XL) 90 MG 24 hr tablet Take 90 mg by mouth every morning.      simvastatin (ZOCOR) 40 MG tablet Take 40 mg by mouth  at bedtime.      TRESIBA FLEXTOUCH 200 UNIT/ML SOPN Inject 56 Units into the skin daily before breakfast.      No current facility-administered medications on file prior to visit.    No Known Allergies   Assessment/Plan:  1. Hypertension -  Patient BP in room today 156/70 which is above goal of <130/80, however patient just took his morning BP medications within the last 20 minutes and has been out of clonidine for 4 days.  Reports home readings have been normal except for last night.    Advised that clonidine acts rapidly and can quickly reduce blood pressure so if he takes 1.5 tablets in morning and night he may have signs/symptoms of hypotension or experience adverse effects such as headaches.  Pt voiced understanding.  Refilled his clonidine for him.  Since patient reports home readings normal and due to renal function, will continue current regimen and recommended patient to call if blood pressure remains increased.  Patient and wife voiced understanding.  Continue clonidine 0.2mg  TID Continue doxozosin  4mg  daily Continue nifedipine 90mg  daily Recheck in 3 months  Karren Cobble, PharmD, BCACP, [place], [place] [address], [place], [place] [address] Phone: [phone]; Fax: [phone] 09/11/2021 10:22 AM

## 2021-12-18 ENCOUNTER — Ambulatory Visit: Payer: HMO | Admitting: Pharmacist

## 2021-12-18 ENCOUNTER — Other Ambulatory Visit: Payer: Self-pay

## 2021-12-18 VITALS — BP 179/73 | HR 72 | Resp 15 | Ht 72.0 in | Wt 228.0 lb

## 2021-12-18 DIAGNOSIS — I10 Essential (primary) hypertension: Secondary | ICD-10-CM

## 2021-12-18 MED ORDER — DOXAZOSIN MESYLATE 8 MG PO TABS: 8.0000 mg | ORAL_TABLET | Freq: Every day | ORAL | 1 refills | Status: AC

## 2021-12-18 NOTE — Patient Instructions (Addendum)
It was nice seeing you again  We would like your blood pressure to be less than 140/90  Continue your clonidine 0.2mg  three times a day Continue nifedipine 90mg  once a day  We will increase your doxazosin to 8mg  once a day  Continue to check your blood pressure at home and watch for signs of low blood pressure such as lightheadedness and dizziness.  If you experience these we can reduce your dose back to 4mg   We will see you back in 2 months  Please call with any questions  Karren Cobble, PharmD, BCACP, [place], [place], [place], [place], [address] Phone: [phone], Fax: [phone]

## 2021-12-18 NOTE — Progress Notes (Signed)
Patient ID: Isaac Stewart                 DOB: December 06, 1941                      MRN: [id number]     HPI: Isaac Stewart is a 81 y.o. male referred by Dr. Gwenlyn Found to HTN clinic. PMH is significant forforHTN, HLD, DM (A1c 7.7), CKD and history of Covid 19.   Patient presents today with wife.  Has been checking blood pressure at home and reports readings of 135/78 and 145/70.  Reports home systolic pressures usually between 130-135. Had recent visit with nephrology and endo. Last A1c 7.4.    Has had busy holiday season and visited great grandchild.  Currently on nifedipine, doxazosin and clonidine. Due to renal function has not been placed on ACE/Arb despite DM diagnosis.  Wife believes he has white coat HTN. Still occasionally forgets midday clonidine dose.  Current HTN meds:  Clonidine 0.2mg  TID Doxazosin 4mg  once daily Nifedipine 90mg  once daily  Wt Readings from Last 3 Encounters:  07/14/21 224 lb 3.2 oz (101.7 kg)  05/18/21 226 lb 12.8 oz (102.9 kg)  04/17/21 226 lb 6.4 oz (102.7 kg)   BP Readings from Last 3 Encounters:  09/11/21 (!) 156/70  07/14/21 (!) 152/82  05/18/21 (!) 150/78   Pulse Readings from Last 3 Encounters:  09/11/21 97  07/14/21 74  05/18/21 80    Renal function: CrCl cannot be calculated (Patient's most recent lab result is older than the maximum 21 days allowed.).  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      CINNAMON PO Take 2 capsules by mouth daily.      cloNIDine (CATAPRES) 0.2 MG tablet Take 1 tablet (0.2 mg total) by mouth 3 (three) times daily. 270 tablet 1   diphenhydrAMINE (BENADRYL) 25 MG tablet  Take 50 mg by mouth every morning.      doxazosin (CARDURA) 4 MG tablet Take 4 mg by mouth daily.     fenofibrate 160 MG tablet Take 160 mg by mouth every morning.      glimepiride (AMARYL) 4 MG tablet Take 4 mg by mouth daily with breakfast. Take 1 tablet in am and 0.5 tablet at night     glucose blood test strip 1 each by Other route 2 (two) times daily. E11.9     Insulin Pen Needle 32G X 4 MM MISC Use once daily     Multiple Vitamin (MULTIVITAMIN) capsule Take 1 capsule by mouth daily.     NIFEdipine (PROCARDIA XL/NIFEDICAL-XL) 90 MG 24 hr tablet Take 90 mg by mouth every morning.      simvastatin (ZOCOR) 40 MG tablet Take 40 mg by mouth at bedtime.      TRESIBA FLEXTOUCH 200 UNIT/ML SOPN Inject 56 Units into the skin daily before breakfast.      No current facility-administered medications on file prior to visit.    No Known Allergies   Assessment/Plan:  1. Hypertension -  Patient BP in room 179/73 which is above goal of <140/90.  Less aggressive goal chosen due to impaired renal function.  Patient reports home BP readings at goal but did not bring log.  Will increase doxazosin to to 8mg  daily at this time.  Gave patient signs/symptoms of hypotension and will reduce dose if he experiences any.  Recheck in clinic in 3 months.  Continue: Clonidine 0.2mg  TID Nifedipine 90mg  once daily  Increase doxazosin to 8mg  daily Recheck in 3 months  Karren Cobble, PharmD, [place], [place], [place], [place], [place], [address] Phone: [phone], Fax: [phone]

## 2022-02-10 ENCOUNTER — Other Ambulatory Visit: Payer: Self-pay

## 2022-02-10 ENCOUNTER — Ambulatory Visit: Payer: HMO | Admitting: Pharmacist

## 2022-02-10 VITALS — BP 130/62 | HR 70 | Resp 14 | Ht 71.0 in | Wt 227.6 lb

## 2022-02-10 DIAGNOSIS — I10 Essential (primary) hypertension: Secondary | ICD-10-CM | POA: Diagnosis not present

## 2022-02-10 NOTE — Progress Notes (Signed)
Patient ID: Nyaire Denbleyker                 DOB: [birth year]                      MRN: [id number] ? ? ? ? ?HPI: ?Isaac Stewart is a 81 y.o. male referred by Dr. Gwenlyn Found to HTN clinic. PMH is significant for T2DM (A1c 8.2), CKD, HTN, and HLD. At last visit, doxazosin was increased to 8mg  daily. ? ?Patient presents today with wife in good spirits.  Had urology appointment this morning and blood pressure was controlled in their office as well.   ? ?Reports since increase in doxazosin blood pressure has been better controlled.  Did not bring log or cuff. Reports he is occasionally tired some days of the week which he relates to recovering from Covid.   ? ?Blood sugar has increased recently however patient and wife report they have not been exercising as much recently in the cold weather. Now that it is warming up he plans to begin walking again around his neighborhood.  Typically takes a 40 minute walk. ? ?No adverse effects to any medications. ? ?Current HTN meds:  ?Doxazosin 8mg  daily ?Clonidine 0.2mg  TID ?Nifedipine 90mg  daily ? ?BP goal: <140/90 ? ? ?Wt Readings from Last 3 Encounters:  ?12/18/21 228 lb (103.4 kg)  ?07/14/21 224 lb 3.2 oz (101.7 kg)  ?05/18/21 226 lb 12.8 oz (102.9 kg)  ? ?BP Readings from Last 3 Encounters:  ?12/18/21 (!) 179/73  ?09/11/21 (!) 156/70  ?07/14/21 (!) 152/82  ? ?Pulse Readings from Last 3 Encounters:  ?12/18/21 72  ?09/11/21 97  ?07/14/21 74  ? ? ?Renal function: ?CrCl cannot be calculated (Patient's most recent lab result is older than the maximum 21 days allowed.). ? ?Past Medical History:  ?Diagnosis Date  ? Allergy   ? Cancer [facility])   ? 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 COVID-19 virus infection 12/24/2019  ? Diabetes ([place])   ? oral medications type 2  ? HTN (hypertension)   ? Hyperlipemia   ? ? ?Current Outpatient Medications on File Prior to Visit  ?Medication Sig Dispense Refill  ? APPLE CIDER VINEGAR PO Take 2 tablets by mouth daily with breakfast.     ? CINNAMON PO Take 2  capsules by mouth daily.     ? cloNIDine (CATAPRES) 0.2 MG tablet Take 1 tablet (0.2 mg total) by mouth 3 (three) times daily. 270 tablet 1  ? diphenhydrAMINE (BENADRYL) 25 MG tablet Take 50 mg by mouth every morning.     ? doxazosin (CARDURA) 8 MG tablet Take 1 tablet (8 mg total) by mouth daily. Dose increase 90 tablet 1  ? ELDERBERRY PO Take by mouth.    ? fenofibrate 160 MG tablet Take 160 mg by mouth every morning.     ? glimepiride (AMARYL) 4 MG tablet Take 4 mg by mouth daily with breakfast. Take 1 tablet in am and 0.5 tablet at night    ? glucose blood test strip 1 each by Other route 2 (two) times daily. E11.9    ? Insulin Pen Needle 32G X 4 MM MISC Use once daily    ? Multiple Vitamin (MULTIVITAMIN) capsule Take 1 capsule by mouth daily.    ? NIFEdipine (PROCARDIA XL/NIFEDICAL-XL) 90 MG 24 hr tablet Take 90 mg by mouth every morning.     ? simvastatin (ZOCOR) 40 MG tablet Take 40 mg by mouth at bedtime.     ?  TRESIBA FLEXTOUCH 200 UNIT/ML SOPN Inject 56 Units into the skin daily before breakfast.     ? ?No current facility-administered medications on file prior to visit.  ? ? ?No Known Allergies ? ? ?Assessment/Plan: ? ?1. Hypertension -  Patient BP in room today 130/62 which is at goal of <140/90. Less aggressive goal selected due to renal function.  Patient reports he feels well and having no adverse effects to any medications.  Will be due for yearly follow up with Dr Gwenlyn Found in May.  No med changes needed at this time. ? ?Continue: ?Doxazosin 8mg  daily ?Clonidine 0.2mg  TID ?Nifedipine 90mg  daily ?Follow up with Dr Gwenlyn Found for yearly visit ? ?Karren Cobble, PharmD, BCACP, [place], CPP ?[address], [address] ?[place], [place], [address] ?Phone: [phone], Fax: [phone]  ?

## 2022-02-10 NOTE — Patient Instructions (Addendum)
It was good seeing you again ? ?We would like your blood pressure to be less than 140/90 ? ?Please continue your:  ? ?Doxazosin 8mg  daily ?Clonidine 0.2mg  three times a day ?Nifedipine 90mg  daily ? ?Try to work on increasing your exercise now that is warming up ? ?Please continue to check your blood pressure at home and call us with any questions ? ?Karren Cobble, PharmD, BCACP, [place], CPP ?[address], [address] ?[place], [place], [address] ?Phone: [phone], Fax: [phone]  ?

## 2022-02-11 ENCOUNTER — Other Ambulatory Visit: Payer: Self-pay | Admitting: Pharmacist Clinician (PhC)/ Clinical Pharmacy Specialist

## 2022-02-11 MED ORDER — FENOFIBRATE 160 MG PO TABS: 160.0000 mg | ORAL_TABLET | Freq: Every morning | ORAL | 3 refills | Status: AC

## 2022-04-30 ENCOUNTER — Encounter: Payer: Self-pay | Admitting: Cardiovascular Disease

## 2022-04-30 ENCOUNTER — Ambulatory Visit: Payer: HMO | Admitting: Cardiovascular Disease

## 2022-04-30 ENCOUNTER — Ambulatory Visit (HOSPITAL_COMMUNITY): Payer: HMO | Attending: Cardiology

## 2022-04-30 VITALS — BP 140/62 | HR 74 | Ht 72.0 in | Wt 228.2 lb

## 2022-04-30 DIAGNOSIS — I350 Nonrheumatic aortic (valve) stenosis: Secondary | ICD-10-CM | POA: Insufficient documentation

## 2022-04-30 DIAGNOSIS — I10 Essential (primary) hypertension: Secondary | ICD-10-CM | POA: Diagnosis not present

## 2022-04-30 DIAGNOSIS — E782 Mixed hyperlipidemia: Secondary | ICD-10-CM | POA: Diagnosis not present

## 2022-04-30 DIAGNOSIS — R0989 Other specified symptoms and signs involving the circulatory and respiratory systems: Secondary | ICD-10-CM

## 2022-04-30 LAB — ECHOCARDIOGRAM COMPLETE
AR max vel: 1.1 cm2
AV Area VTI: 1.18 cm2
AV Area mean vel: 1.03 cm2
AV Mean grad: 11.5 mmHg
AV Peak grad: 19.9 mmHg
Ao pk vel: 2.23 m/s
Area-P 1/2: 2.82 cm2
Height: 72 in
S' Lateral: 2.4 cm
Weight: 3651.2 oz

## 2022-04-30 MED ORDER — PERFLUTREN LIPID MICROSPHERE
1.0000 mL | INTRAVENOUS | Status: AC | PRN
  Administered 2022-04-30: 2 mL via INTRAVENOUS

## 2022-04-30 NOTE — Patient Instructions (Signed)
Medication Instructions:  Your physician recommends that you continue on your current medications as directed. Please refer to the Current Medication list given to you today.  *If you need a refill on your cardiac medications before your next appointment, please call your pharmacy*   Testing/Procedures: Your physician has requested that you have a carotid duplex. This test is an ultrasound of the carotid arteries in your neck. It looks at blood flow through these arteries that supply the brain with blood. Allow one hour for this exam. There are no restrictions or special instructions. This procedure will be done at [address]   Dr. Gwenlyn Found has ordered a CT coronary calcium score.   Test locations:  [facility] ([address] [place], [place] [address]) [facility] ([address] [place], [place] [address])   This is $99 out of pocket.   Coronary CalciumScan A coronary calcium scan is an imaging test used to look for deposits of calcium and other fatty materials (plaques) in the inner lining of the blood vessels of the heart (coronary arteries). These deposits of calcium and plaques can partly clog and narrow the coronary arteries without producing any symptoms or warning signs. This puts a person at risk for a heart attack. This test can detect these deposits before symptoms develop. Tell a health care provider about: Any allergies you have. All medicines you are taking, including vitamins, herbs, eye drops, creams, and over-the-counter medicines. Any problems you or family members have had with anesthetic medicines. Any blood disorders you have. Any surgeries you have had. Any medical conditions you have. Whether you are pregnant or may be pregnant. What are the risks? Generally, this is a safe procedure. However, problems may occur, including: Harm to a pregnant woman and her unborn baby. This test involves the use of radiation. Radiation exposure  can be dangerous to a pregnant woman and her unborn baby. If you are pregnant, you generally should not have this procedure done. Slight increase in the risk of cancer. This is because of the radiation involved in the test. What happens before the procedure? No preparation is needed for this procedure. What happens during the procedure? You will undress and remove any jewelry around your neck or chest. You will put on a hospital gown. Sticky electrodes will be placed on your chest. The electrodes will be connected to an electrocardiogram (ECG) machine to record a tracing of the electrical activity of your heart. A CT scanner will take pictures of your heart. During this time, you will be asked to lie still and hold your breath for 2-3 seconds while a picture of your heart is being taken. The procedure may vary among health care providers and hospitals. What happens after the procedure? You can get dressed. You can return to your normal activities. It is up to you to get the results of your test. Ask your health care provider, or the department that is doing the test, when your results will be ready. Summary A coronary calcium scan is an imaging test used to look for deposits of calcium and other fatty materials (plaques) in the inner lining of the blood vessels of the heart (coronary arteries). Generally, this is a safe procedure. Tell your health care provider if you are pregnant or may be pregnant. No preparation is needed for this procedure. A CT scanner will take pictures of your heart. You can return to your normal activities after the scan is done. This information is not  intended to replace advice given to you by your health care provider. Make sure you discuss any questions you have with your health care provider. Document Released: 05/27/2008 Document Revised: 10/18/2016 Document Reviewed: 10/18/2016 Elsevier Interactive Patient Education  2017 [place]: At [facility], you and your health needs are our priority.  As part of our continuing mission to provide you with exceptional heart care, we have created designated Provider Care Teams.  These Care Teams include your primary Cardiologist (physician) and Advanced Practice Providers (APPs -  Physician Assistants and Nurse Practitioners) who all work together to provide you with the care you need, when you need it.  We recommend signing up for the patient portal called "MyChart".  Sign up information is provided on this After Visit Summary.  MyChart is used to connect with patients for Virtual Visits (Telemedicine).  Patients are able to view lab/test results, encounter notes, upcoming appointments, etc.  Non-urgent messages can be sent to your provider as well.   To learn more about what you can do with MyChart, go to [email or web address].    Your next appointment:   12 month(s)  The format for your next appointment:   In Person  Provider:   Quay Burow, MD

## 2022-04-30 NOTE — Assessment & Plan Note (Signed)
History of mild aortic stenosis by 2D echo performed because of his auscultated murmur 05/15/2021.  His aortic valve area is 1.79 cm with a peak gradient of 28 mmHg.  He is scheduled for repeat echo today.  He is asymptomatic from this however.

## 2022-04-30 NOTE — Assessment & Plan Note (Signed)
History of essential hypertension a blood pressure measured today at 140/62.  He is on Procardia.

## 2022-04-30 NOTE — Assessment & Plan Note (Addendum)
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  His most recent lipid profile performed 4 months ago revealed total cholesterol 189, LDL 117 and HDL of 39.  I am going to get a coronary calcium score to help determine how aggressive to be with receptor modification.

## 2022-04-30 NOTE — Progress Notes (Signed)
04/30/2022 Isaac Stewart   1941/11/01  [id number]  Primary Physician Manfred Shirts, [place] Primary Cardiologist: Lorretta Harp MD Lupe Carney, [place]  HPI:  [place] Isaac Stewart is a 81 y.o.  mildly overweight married Caucasian male father of 68, grandfather of 4 grandchildren who is wife [name] is accompanying him today.  They were both patients of mine in the past but I have not seen him in many years.  They referred by his PCP for a recently auscultated murmur.  I last saw him in the office 04/17/2021.  He is retired from being in Market researcher.  His risk factors otherwise are notable for treated hypertension, diabetes and hyperlipidemia.  There is no family history for heart disease.  He is never had a heart attack or stroke.  He does complain of some dyspnea more noticeable since he and his wife both had COVID 2 years ago.  He is fairly active and mows his own yard.  His PCP recently auscultated a murmur and referred him here for further evaluation.  Since I saw him a year ago he did have a 2D echo performed 05/15/2021 that showed mild aortic stenosis with normal LV function.  It is aortic valve measured 1.79 cm with a peak gradient of 28 mmHg.  He is relatively asymptomatic from this.   Current Meds  Medication Sig   APPLE CIDER VINEGAR PO Take 2 tablets by mouth daily with breakfast.    CINNAMON PO Take 2 capsules by mouth daily.    cloNIDine (CATAPRES) 0.2 MG tablet Take 1 tablet (0.2 mg total) by mouth 3 (three) times daily.   diphenhydrAMINE (BENADRYL) 25 MG tablet Take 50 mg by mouth every morning.    doxazosin (CARDURA) 8 MG tablet Take 1 tablet (8 mg total) by mouth daily. Dose increase   ELDERBERRY PO Take by mouth.   fenofibrate 160 MG tablet Take 1 tablet (160 mg total) by mouth every morning.   glimepiride (AMARYL) 4 MG tablet Take 4 mg by mouth daily with breakfast. Take 1 tablet in am and 0.5 tablet at night   glucose blood test strip 1 each by Other route 2 (two) times  daily. E11.9   Insulin Pen Needle 32G X 4 MM MISC Use once daily   Multiple Vitamin (MULTIVITAMIN) capsule Take 1 capsule by mouth daily.   NIFEdipine (PROCARDIA XL/NIFEDICAL-XL) 90 MG 24 hr tablet Take 90 mg by mouth every morning.    simvastatin (ZOCOR) 40 MG tablet Take 40 mg by mouth at bedtime.    TRESIBA FLEXTOUCH 200 UNIT/ML SOPN Inject 56 Units into the skin daily before breakfast.      No Known Allergies  Social History   Socioeconomic History   Marital status: Married    Spouse name: Not on file   Number of children: 3   Years of education: Not on file   Highest education level: Not on file  Occupational History   Occupation: semi retired Mining engineer  Tobacco Use   Smoking status: Former    Types: Cigarettes    Quit date: 08/06/2001    Years since quitting: 20.7   Smokeless tobacco: Never  Vaping Use   Vaping Use: Never used  Substance and Sexual Activity   Alcohol use: Yes    Comment: occ social   Drug use: Never   Sexual activity: Not on file  Other Topics Concern   Not on file  Social History Narrative   Lives with wife  in trilevel home that they own.   He is semi retired Mining engineer   3 adult children, 2 daughters- one lives in [place] and one lives in [place], one son- lives in [place]   Have grandchildren and great grandchildren that also live in the area   Social Determinants of Health   Financial Resource Strain: Not on file  Food Insecurity: Not on file  Transportation Needs: Not on file  Physical Activity: Not on file  Stress: Not on file  Social Connections: Not on file  Intimate Partner Violence: Not on file     Review of Systems: General: negative for chills, fever, night sweats or weight changes.  Cardiovascular: negative for chest pain, dyspnea on exertion, edema, orthopnea, palpitations, paroxysmal nocturnal dyspnea or shortness of breath Dermatological: negative for rash Respiratory: negative for cough or  wheezing Urologic: negative for hematuria Abdominal: negative for nausea, vomiting, diarrhea, bright red blood per rectum, melena, or hematemesis Neurologic: negative for visual changes, syncope, or dizziness All other systems reviewed and are otherwise negative except as noted above.    Blood pressure 140/62, pulse 74, height 6' (1.829 m), weight 228 lb 3.2 oz (103.5 kg), SpO2 98 %.  General appearance: alert and no distress Neck: no adenopathy, no JVD, supple, symmetrical, trachea midline, thyroid not enlarged, symmetric, no tenderness/mass/nodules, and soft bilateral carotid bruits Lungs: clear to auscultation bilaterally Heart: Soft outflow tract murmur Extremities: extremities normal, atraumatic, no cyanosis or edema Pulses: 2+ and symmetric Skin: Skin color, texture, turgor normal. No rashes or lesions Neurologic: Grossly normal  EKG sinus rhythm at 74 with nonspecific ST and T wave changes.  I personally reviewed this EKG.  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  His most recent lipid profile performed 4 months ago revealed total cholesterol 189, LDL 117 and HDL of 39.  I am going to get a coronary calcium score to help determine how aggressive to be with receptor modification.  Hypertension, benign History of essential hypertension a blood pressure measured today at 140/62.  He is on Procardia.  Mild aortic stenosis History of mild aortic stenosis by 2D echo performed because of his auscultated murmur 05/15/2021.  His aortic valve area is 1.79 cm with a peak gradient of 28 mmHg.  He is scheduled for repeat echo today.  He is asymptomatic from this however.     Lorretta Harp MD FACP,FACC,FAHA, [facility] 04/30/2022 10:35 AM

## 2022-05-14 ENCOUNTER — Ambulatory Visit (HOSPITAL_COMMUNITY)
Admission: RE | Admit: 2022-05-14 | Discharge: 2022-05-14 | Disposition: A | Discharge status: Home / Self Care | Payer: HMO | Source: Ambulatory Visit | Attending: Cardiovascular Disease | Admitting: Cardiovascular Disease

## 2022-05-14 DIAGNOSIS — R0989 Other specified symptoms and signs involving the circulatory and respiratory systems: Secondary | ICD-10-CM | POA: Diagnosis not present

## 2022-05-21 ENCOUNTER — Telehealth: Payer: Self-pay | Admitting: Cardiovascular Disease

## 2022-05-21 DIAGNOSIS — I10 Essential (primary) hypertension: Secondary | ICD-10-CM

## 2022-05-21 MED ORDER — CLONIDINE HCL 0.2 MG PO TABS: 0.2000 mg | ORAL_TABLET | Freq: Three times a day (TID) | ORAL | 1 refills | Status: DC

## 2022-05-21 NOTE — Telephone Encounter (Signed)
*  STAT* If patient is at the pharmacy, call can be transferred to refill team.   1. Which medications need to be refilled? (please list name of each medication and dose if known) cloNIDine (CATAPRES) 0.2 MG tablet  2. Which pharmacy/location (including street and city if local pharmacy) is medication to be sent to? [facility] # [place], [place]  3. Do they need a 30 day or 90 day supply? 90 day  Patient is out of medication

## 2022-05-28 ENCOUNTER — Ambulatory Visit (HOSPITAL_BASED_OUTPATIENT_CLINIC_OR_DEPARTMENT_OTHER)
Admission: RE | Admit: 2022-05-28 | Discharge: 2022-05-28 | Disposition: A | Discharge status: Home / Self Care | Payer: HMO | Source: Ambulatory Visit | Attending: Cardiovascular Disease | Admitting: Cardiovascular Disease

## 2022-05-28 DIAGNOSIS — I350 Nonrheumatic aortic (valve) stenosis: Secondary | ICD-10-CM

## 2022-05-28 DIAGNOSIS — I10 Essential (primary) hypertension: Secondary | ICD-10-CM

## 2022-05-28 DIAGNOSIS — E782 Mixed hyperlipidemia: Secondary | ICD-10-CM

## 2022-05-28 DIAGNOSIS — R0989 Other specified symptoms and signs involving the circulatory and respiratory systems: Secondary | ICD-10-CM

## 2022-06-18 ENCOUNTER — Inpatient Hospital Stay: Admission: RE | Admit: 2022-06-18 | Payer: HMO | Source: Ambulatory Visit

## 2022-06-25 ENCOUNTER — Encounter: Payer: Self-pay | Admitting: Cardiovascular Disease

## 2022-06-25 ENCOUNTER — Ambulatory Visit (INDEPENDENT_AMBULATORY_CARE_PROVIDER_SITE_OTHER): Payer: PPO | Admitting: Cardiovascular Disease

## 2022-06-25 VITALS — BP 149/77 | HR 77 | Ht 72.0 in | Wt 222.0 lb

## 2022-06-25 DIAGNOSIS — I350 Nonrheumatic aortic (valve) stenosis: Secondary | ICD-10-CM | POA: Diagnosis not present

## 2022-06-25 DIAGNOSIS — E782 Mixed hyperlipidemia: Secondary | ICD-10-CM | POA: Diagnosis not present

## 2022-06-25 MED ORDER — ATORVASTATIN CALCIUM 80 MG PO TABS: 80.0000 mg | ORAL_TABLET | Freq: Every day | ORAL | 3 refills | Status: AC

## 2022-06-25 NOTE — Progress Notes (Signed)
Isaac Stewart returns today for follow-up of his outpatient noninvasive test.  His 2D echo showed normal LV function with mild to moderate aortic stenosis.  His aortic valve area measured 1.18 cm the peak gradient of 19 mmHg.  He is completely asymptomatic and very active.  His coronary calcium score performed 05/28/2022 was 970 with majority of the calcium in the LAD.  His LDL is 117 on simvastatin.  I am going to change him to atorvastatin 80 mg a day with an LDL goal of less than 70 given his elevated coronary calcium score.  We will recheck a lipid lipid liver profile in 3 months, 2D echo on an annual basis.  I will see him back in 6 months for follow-up.  Lorretta Harp, M.D., [place], [facility], [place] [address] [place], [place]  [address]  [phone] 06/25/2022 10:22 AM

## 2022-06-25 NOTE — Patient Instructions (Signed)
Medication Instructions:   -Stop simvastatin.  -Start atorvastatin (lipitor) '80mg'$  once daily.  *If you need a refill on your cardiac medications before your next appointment, please call your pharmacy*   Lab Work: Your physician recommends that you return for lab work in: 3 months for FASTING lipid/liver panel    If you have labs (blood work) drawn today and your tests are completely normal, you will receive your results only by: [place] (if you have MyChart) OR A paper copy in the mail If you have any lab test that is abnormal or we need to change your treatment, we will call you to review the results.   Testing/Procedures: Your physician has requested that you have an echocardiogram. Echocardiography is a painless test that uses sound waves to create images of your heart. It provides your doctor with information about the size and shape of your heart and how well your heart's chambers and valves are working. This procedure takes approximately one hour. There are no restrictions for this procedure. To be done in May 2024. This procedure will be done at [address]     Follow-Up: At [facility], you and your health needs are our priority.  As part of our continuing mission to provide you with exceptional heart care, we have created designated Provider Care Teams.  These Care Teams include your primary Cardiologist (physician) and Advanced Practice Providers (APPs -  Physician Assistants and Nurse Practitioners) who all work together to provide you with the care you need, when you need it.  We recommend signing up for the patient portal called "MyChart".  Sign up information is provided on this After Visit Summary.  MyChart is used to connect with patients for Virtual Visits (Telemedicine).  Patients are able to view lab/test results, encounter notes, upcoming appointments, etc.  Non-urgent messages can be sent to your provider as well.   To learn more about what  you can do with MyChart, go to [email or web address].    Your next appointment:   6 month(s)  The format for your next appointment:   In Person  Provider:  Quay Burow, MD

## 2023-01-21 ENCOUNTER — Telehealth: Payer: Self-pay | Admitting: Cardiovascular Disease

## 2023-01-21 NOTE — Telephone Encounter (Signed)
*  STAT* If patient is at the pharmacy, call can be transferred to refill team.   1. Which medications need to be refilled? (please list name of each medication and dose if known)  cloNIDine (CATAPRES) 0.2 MG tablet  2. Which pharmacy/location (including street and city if local pharmacy) is medication to be sent to? [facility] # [place], [place]  3. Do they need a 30 day or 90 day supply?  90 day supply

## 2023-01-24 ENCOUNTER — Other Ambulatory Visit: Payer: Self-pay | Admitting: Cardiovascular Disease

## 2023-01-24 DIAGNOSIS — I10 Essential (primary) hypertension: Secondary | ICD-10-CM

## 2023-01-24 NOTE — Telephone Encounter (Signed)
Pt is seeing Cardiologist today.

## 2023-01-25 ENCOUNTER — Other Ambulatory Visit: Payer: Self-pay

## 2023-01-25 DIAGNOSIS — I10 Essential (primary) hypertension: Secondary | ICD-10-CM

## 2023-01-25 MED ORDER — CLONIDINE HCL 0.2 MG PO TABS: 0.2000 mg | ORAL_TABLET | Freq: Three times a day (TID) | ORAL | 0 refills | Status: DC

## 2023-01-25 NOTE — Telephone Encounter (Signed)
Sent RX to pharmacy as requested.

## 2023-01-25 NOTE — Telephone Encounter (Signed)
Pt c/o medication issue:  1. Name of Medication:   cloNIDine (CATAPRES) 0.2 MG tablet    2. How are you currently taking this medication (dosage and times per day)?   3. Are you having a reaction (difficulty breathing--STAT)?   4. What is your medication issue?    Pt's wife is calling stating [facility] says they do not have this prescription. Requesting it be sent again.

## 2023-05-10 ENCOUNTER — Ambulatory Visit (HOSPITAL_COMMUNITY): Payer: PPO | Attending: Internal Medicine

## 2023-05-10 DIAGNOSIS — I350 Nonrheumatic aortic (valve) stenosis: Secondary | ICD-10-CM | POA: Diagnosis present

## 2023-05-10 DIAGNOSIS — E782 Mixed hyperlipidemia: Secondary | ICD-10-CM | POA: Diagnosis present

## 2023-05-10 LAB — ECHOCARDIOGRAM COMPLETE
AR max vel: 1.83 cm2
AV Area VTI: 1.91 cm2
AV Area mean vel: 1.78 cm2
AV Mean grad: 15 mmHg
AV Peak grad: 26.5 mmHg
Ao pk vel: 2.58 m/s
Area-P 1/2: 3.03 cm2
S' Lateral: 3.3 cm

## 2023-06-22 NOTE — Progress Notes (Signed)
Cardiology Clinic Note   Patient Name: Isaac Stewart Date of Encounter: 06/24/2023  Primary Care Provider:  Shellia Cleverly, PA Primary Cardiologist:  None  Patient Profile    Isaac Stewart 82 year old male presents to the clinic today for follow-up evaluation essential hypertension and hyperlipidemia.  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   COVID-19 virus infection 12/24/2019   Diabetes (HCC)    oral medications type 2   HTN (hypertension)    Hyperlipemia    Past Surgical History:  Procedure Laterality Date   BLADDER SURGERY     CATARACT EXTRACTION, BILATERAL     HERNIA REPAIR     INGUINAL HERNIA REPAIR Right 02/15/2019   Procedure: PRIMARY RIGHT INCARCERATED INGUINAL HERNIA REPAIR;  Surgeon: Romie Levee, MD;  Location: [facility];  Service: General;  Laterality: Right;   PROSTATE SURGERY      Allergies  No Known Allergies  History of Present Illness    Isaac Stewart is a PMH of COVID-19 infection, DOE, vitamin D deficiency, HLD, HTN, AKI, type 2 diabetes, colon cancer, allergic rhinitis, and mild aortic atherosclerosis.  He was seen in follow-up by Dr. Allyson Sabal on 06/25/2022.  During that time he was returning for results of his outpatient noninvasive testing.  His echocardiogram showed normal LV function and mild-moderate aortic stenosis.  He had no cardiac complaints.  He was very active.  His coronary calcium score 05/28/2022 was 970 and showed a majority of his calcium in his LAD.  His LDL was noted to be 117.  He was on simvastatin.  He was transition to a atorvastatin 80 mg daily with a goal of LDL less than 70.  Plan for reevaluating his liver and lipid panel was made for 3 months.  Echocardiogram was also recommended on an annual basis.  Follow-up was planned for 6 months.  He presents to the clinic today for follow-up evaluation and states he has been helping his son  install a new HVAC system.  He has strained some back muscles.  He is not limited in his mobility.  He reports that he has been taking Tylenol which has been helping with his pain.  He continues to be physically active doing yard work and jobs.  He reports that he has been doing less walking with increased temperatures.  We reviewed his echocardiogram.  He and his wife expressed understanding.  We will schedule repeat echocardiogram 15 Apr 2024.  I discussed the importance of good cholesterol control.  He follows with his PCP for this.  He will have fasting lab work done next week.  Will plan follow-up in 9 to 12 months..  Today the denies chest pain, shortness of breath, lower extremity edema, fatigue, palpitations, melena, hematuria, hemoptysis, diaphoresis, weakness, presyncope, syncope, orthopnea, and PND.    Home Medications    Prior to Admission medications   Medication Sig Start Date End Date Taking? Authorizing Provider  APPLE CIDER VINEGAR PO Take 2 tablets by mouth daily with breakfast.     [provider]  atorvastatin (LIPITOR) 80 MG tablet Take 1 tablet (80 mg total) by mouth daily. 06/25/22   Runell Gess, MD  CINNAMON PO Take 2 capsules by mouth daily.     [provider]  cloNIDine (CATAPRES) 0.2 MG tablet Take 1 tablet (0.2 mg total) by mouth 3 (three) times daily. 01/25/23   Allyson Sabal,  Delton See, MD  diphenhydrAMINE (BENADRYL) 25 MG tablet Take 50 mg by mouth every morning.     [provider]  doxazosin (CARDURA) 8 MG tablet Take 1 tablet (8 mg total) by mouth daily. Dose increase 12/18/21   Runell Gess, MD  ELDERBERRY PO Take by mouth.    [provider]  fenofibrate 160 MG tablet Take 1 tablet (160 mg total) by mouth every morning. 02/11/22   Runell Gess, MD  glimepiride (AMARYL) 4 MG tablet Take 4 mg by mouth daily with breakfast. Take 1 tablet in am and 0.5 tablet at night 07/10/14   [provider]  glucose blood test strip  1 each by Other route 2 (two) times daily. E11.9 10/06/17   [provider]  Insulin Pen Needle 32G X 4 MM MISC Use once daily 07/05/18   [provider]  Multiple Vitamin (MULTIVITAMIN) capsule Take 1 capsule by mouth daily. 01/11/11   [provider]  NIFEdipine (PROCARDIA XL/NIFEDICAL-XL) 90 MG 24 hr tablet Take 90 mg by mouth every morning.  06/22/18   [provider]  TRESIBA FLEXTOUCH 200 UNIT/ML SOPN Inject 56 Units into the skin daily before breakfast.  12/18/19   [provider]    Family History    Family History  Problem Relation Age of Onset   Cancer Father    Heart disease Maternal Grandfather    Colon cancer Neg Hx    Colon polyps Neg Hx    Esophageal cancer Neg Hx    Stomach cancer Neg Hx    Rectal cancer Neg Hx    He indicated that the status of his father is unknown. He indicated that the status of his maternal grandfather is unknown. He indicated that the status of his neg hx is unknown.  Social History    Social History   Socioeconomic History   Marital status: Married    Spouse name: Not on file   Number of children: 3   Years of education: Not on file   Highest education level: Not on file  Occupational History   Occupation: semi retired Radio producer  Tobacco Use   Smoking status: Former    Current packs/day: 0.00    Types: Cigarettes    Quit date: 08/06/2001    Years since quitting: 21.8   Smokeless tobacco: Never  Vaping Use   Vaping status: Never Used  Substance and Sexual Activity   Alcohol use: Yes    Comment: occ social   Drug use: Never   Sexual activity: Not on file  Other Topics Concern   Not on file  Social History Narrative   Lives with wife in [place] home that they own.   He is semi retired Radio producer   3 adult children, 2 daughters- one lives in [place] and one lives in [place], one son- lives in [place]   Have grandchildren and great grandchildren that also live in the  area   Social Determinants of Health   Financial Resource Strain: Low Risk  (01/11/2020)   Overall Financial Resource Strain (CARDIA)    Difficulty of Paying Living Expenses: Not hard at all  Food Insecurity: No Food Insecurity (10/30/2020)   Hunger Vital Sign    Worried About Running Out of Food in the Last Year: Never true    Ran Out of Food in the Last Year: Never true  Transportation Needs: No Transportation Needs (01/11/2020)   PRAPARE - Administrator, Civil Service (  Medical): No    Lack of Transportation (Non-Medical): No  Physical Activity: Inactive (01/11/2020)   Exercise Vital Sign    Days of Exercise per Week: 0 days    Minutes of Exercise per Session: 0 min  Stress: No Stress Concern Present (01/11/2020)   [employer] of Occupational Health - Occupational Stress Questionnaire    Feeling of Stress : Only a little  Social Connections: Unknown (01/11/2020)   Social Connection and Isolation Panel [NHANES]    Frequency of Communication with Friends and Family: More than three times a week    Frequency of Social Gatherings with Friends and Family: Not on file    Attends Religious Services: Not on file    Active Member of Clubs or Organizations: Not on file    Attends Banker Meetings: Not on file    Marital Status: Married  Intimate Partner Violence: Not on file     Review of Systems    General:  No chills, fever, night sweats or weight changes.  Cardiovascular:  No chest pain, dyspnea on exertion, edema, orthopnea, palpitations, paroxysmal nocturnal dyspnea. Dermatological: No rash, lesions/masses Respiratory: No cough, dyspnea Urologic: No hematuria, dysuria Abdominal:   No nausea, vomiting, diarrhea, bright red blood per rectum, melena, or hematemesis Neurologic:  No visual changes, wkns, changes in mental status. All other systems reviewed and are otherwise negative except as noted above.  Physical Exam    VS:  BP 138/62 (BP Location:  Left Arm, Patient Position: Sitting, Cuff Size: Normal)   Pulse 69   Ht 5\' 11"  (1.803 m)   Wt 219 lb 6.4 oz (99.5 kg)   SpO2 96%   BMI 30.60 kg/m  , BMI Body mass index is 30.6 kg/m. GEN: Well nourished, well developed, in no acute distress. HEENT: normal. Neck: Supple, no JVD, carotid bruits, or masses. Cardiac: RRR, 3/6 systolic murmur heard along right sternal border, rubs, or gallops. No clubbing, cyanosis, edema.  Radials/DP/PT 2+ and equal bilaterally.  Respiratory:  Respirations regular and unlabored, clear to auscultation bilaterally. GI: Soft, nontender, nondistended, BS + x 4. MS: no deformity or atrophy. Skin: warm and dry, no rash. Neuro:  Strength and sensation are intact. Psych: Normal affect.  Accessory Clinical Findings    Recent Labs: No results found for requested labs within last 365 days.   Recent Lipid Panel    Component Value Date/Time   TRIG 147 12/27/2019 1427         ECG personally reviewed by me today- EKG Interpretation Date/Time:  Friday June 24 2023 08:00:31 EDT Ventricular Rate:  69 PR Interval:  172 QRS Duration:  100 QT Interval:  404 QTC Calculation: 432 R Axis:   -15  Text Interpretation: Normal sinus rhythm Minimal voltage criteria for LVH, may be normal variant Confirmed by Edd Fabian [phone]) on 06/24/2023 8:05:25 AM   - No acute changes   Echocardiogram 05/10/2023  IMPRESSIONS     1. Left ventricular ejection fraction, by estimation, is 55 to 60%. Left  ventricular ejection fraction by 3D volume is 55 %. The left ventricle has  normal function. The left ventricle has no regional wall motion  abnormalities. There is mild left  ventricular hypertrophy. Left ventricular diastolic parameters are  consistent with Grade I diastolic dysfunction (impaired relaxation).  Elevated left ventricular end-diastolic pressure.   2. Right ventricular systolic function is low normal. The right  ventricular size is normal. There is normal  pulmonary artery systolic  pressure. The estimated right  ventricular systolic pressure is 23.1 mmHg.   3. The mitral valve is abnormal. Trivial mitral valve regurgitation.  Moderate to severe mitral annular calcification.   4. The right coronary cusp appears immobile. The aortic valve is  tricuspid. Aortic valve regurgitation is not visualized. Mild aortic valve  stenosis. Aortic valve area, by VTI measures 1.91 cm. Aortic valve mean  gradient measures 15.0 mmHg. Aortic  valve Vmax measures 2.58 m/s.   5. The inferior vena cava is normal in size with greater than 50%  respiratory variability, suggesting right atrial pressure of 3 mmHg.   Comparison(s): Changes from prior study are noted. 04/30/2022: LVEF 50-55%,  mild to moderate AS -mean gradient 11.5 mmHg.   FINDINGS   Left Ventricle: Left ventricular ejection fraction, by estimation, is 55  to 60%. Left ventricular ejection fraction by 3D volume is 55 %. The left  ventricle has normal function. The left ventricle has no regional wall  motion abnormalities. The left  ventricular internal cavity size was normal in size. There is mild left  ventricular hypertrophy. Left ventricular diastolic parameters are  consistent with Grade I diastolic dysfunction (impaired relaxation).  Elevated left ventricular end-diastolic  pressure.   Right Ventricle: The right ventricular size is normal. No increase in  right ventricular wall thickness. Right ventricular systolic function is  low normal. There is normal pulmonary artery systolic pressure. The  tricuspid regurgitant velocity is 2.24 m/s,   and with an assumed right atrial pressure of 3 mmHg, the estimated right  ventricular systolic pressure is 23.1 mmHg.   Left Atrium: Left atrial size was normal in size.   Right Atrium: Right atrial size was normal in size.   Pericardium: There is no evidence of pericardial effusion.   Mitral Valve: The mitral valve is abnormal. Moderate to severe  mitral  annular calcification. Trivial mitral valve regurgitation.   Tricuspid Valve: The tricuspid valve is grossly normal. Tricuspid valve  regurgitation is trivial.   Aortic Valve: The right coronary cusp appears immobile. The aortic valve  is tricuspid. Aortic valve regurgitation is not visualized. Mild aortic  stenosis is present. Aortic valve mean gradient measures 15.0 mmHg. Aortic  valve peak gradient measures 26.5  mmHg. Aortic valve area, by VTI measures 1.91 cm.   Pulmonic Valve: The pulmonic valve was normal in structure. Pulmonic valve  regurgitation is not visualized.   Aorta: The aortic root and ascending aorta are structurally normal, with  no evidence of dilitation.   Venous: The inferior vena cava is normal in size with greater than 50%  respiratory variability, suggesting right atrial pressure of 3 mmHg.   IAS/Shunts: No atrial level shunt detected by color flow Doppler.      Coronary calcium scoring 05/28/2022  EXAM: Coronary Calcium Score   TECHNIQUE: A gated, non-contrast computed tomography scan of the heart was performed using 3mm slice thickness. Axial images were analyzed on a dedicated workstation. Calcium scoring of the coronary arteries was performed using the Agatston method.   FINDINGS: Coronary arteries: Normal origins.   Coronary Calcium Score:   Left main: 101   Left anterior descending artery: 687   Left circumflex artery: 0   Right coronary artery: 181   Total: 970   Percentile: 71st   Pericardium: Normal.   Aorta: Normal caliber.  Aortic atherosclerosis.   Aortic valve calcification.  Mitral annular calcification.   Non-cardiac: See separate report from [facility].   IMPRESSION: 1. Coronary calcium score of 970. This was 71st percentile  for age-, race-, and sex-matched controls. 2. Aortic valve and mitral annular calcification. 3. Aortic atherosclerosis.   RECOMMENDATIONS: Coronary artery calcium (CAC)  score is a strong predictor of incident coronary heart disease (CHD) and provides predictive information beyond traditional risk factors. CAC scoring is reasonable to use in the decision to withhold, postpone, or initiate statin therapy in intermediate-risk or selected borderline-risk asymptomatic adults (age 34-75 years and LDL-C >=70 to <190 mg/dL) who do not have diabetes or established atherosclerotic cardiovascular disease (ASCVD).* In intermediate-risk (10-year ASCVD risk >=7.5% to <20%) adults or selected borderline-risk (10-year ASCVD risk >=5% to <7.5%) adults in whom a CAC score is measured for the purpose of making a treatment decision the following recommendations have been made:   If CAC=0, it is reasonable to withhold statin therapy and reassess in 5 to 10 years, as long as higher risk conditions are absent (diabetes mellitus, family history of premature CHD in first degree relatives (males <55 years; females <65 years), cigarette smoking, or LDL >=190 mg/dL).   If CAC is 1 to 99, it is reasonable to initiate statin therapy for patients >=72 years of age.   If CAC is >=100 or >=75th percentile, it is reasonable to initiate statin therapy at any age.   Cardiology referral should be considered for patients with CAC scores >=400 or >=75th percentile.   *2018 AHA/ACC/AACVPR/AAPA/ABC/ACPM/ADA/AGS/APhA/ASPC/NLA/PCNA Guideline on the Management of Blood Cholesterol: A Report of the American College of Cardiology/American Heart Association Task Force on Clinical Practice Guidelines. J Am Coll Cardiol. 2019;73(24):3168-3209.   Zoila Shutter, MD Assessment & Plan   1.  Coronary artery disease-no chest pain today.  Remains physically active.  Denies exertional chest discomfort.  Coronary calcium scoring 6/23 showed a coronary calcium score of 970.  Echocardiogram at that time showed normal LV function and mild-moderate aortic stenosis. Continue current medical therapy Heart  healthy low-sodium diet Maintain physical activity  Hyperlipidemia-reviewed importance of good cholesterol management.  He and his wife expressed understanding. Continue atorvastatin High-fiber diet Follows with pcp  Essential hypertension-BP today 138/62. Maintain blood pressure log Continue doxazosin, clonidine Heart healthy low-sodium diet  Aortic stenosis-no increased DOE or activity intolerance.  Echocardiogram 05/10/2023 showed an LVEF of 55-60%, G1 DD, mild aortic valve stenosis with a mean gradient of 15 mmHg. Continue atorvastatin Plan for repeat echocardiogram 5/25  Disposition: Follow-up with Dr.Berry or me in 9-12 months.   Thomasene Ripple. Meridee Branum NP-C     06/24/2023, 8:05 AM [facility] [address] Office [phone] Fax [phone]    I spent 14 minutes examining this patient, reviewing medications, and using patient centered shared decision making involving her cardiac care.  Prior to her visit I spent greater than 20 minutes reviewing her past medical history,  medications, and prior cardiac tests.

## 2023-06-24 ENCOUNTER — Encounter: Payer: Self-pay | Admitting: General Practice

## 2023-06-24 ENCOUNTER — Ambulatory Visit: Payer: PPO | Attending: General Practice | Admitting: General Practice

## 2023-06-24 VITALS — BP 138/62 | HR 69 | Ht 71.0 in | Wt 219.4 lb

## 2023-06-24 DIAGNOSIS — I10 Essential (primary) hypertension: Secondary | ICD-10-CM

## 2023-06-24 DIAGNOSIS — I350 Nonrheumatic aortic (valve) stenosis: Secondary | ICD-10-CM

## 2023-06-24 DIAGNOSIS — I2510 Atherosclerotic heart disease of native coronary artery without angina pectoris: Secondary | ICD-10-CM | POA: Diagnosis not present

## 2023-06-24 DIAGNOSIS — E782 Mixed hyperlipidemia: Secondary | ICD-10-CM | POA: Diagnosis not present

## 2023-06-24 NOTE — Patient Instructions (Addendum)
Medication Instructions:  Your physician recommends that you continue on your current medications as directed. Please refer to the Current Medication list given to you today.  *If you need a refill on your cardiac medications before your next appointment, please call your pharmacy*   Testing/Procedures: Your physician has requested that you have an echocardiogram. Echocardiography is a painless test that uses sound waves to create images of your heart. It provides your doctor with information about the size and shape of your heart and how well your heart's chambers and valves are working. This procedure takes approximately one hour. There are no restrictions for this procedure. Please do NOT wear cologne, perfume, aftershave, or lotions (deodorant is allowed). Please arrive 15 minutes prior to your appointment time. This will take place at [address] [place] **To do in May**    Follow-Up: At [facility], you and your health needs are our priority.  As part of our continuing mission to provide you with exceptional heart care, we have created designated Provider Care Teams.  These Care Teams include your primary Cardiologist (physician) and Advanced Practice Providers (APPs -  Physician Assistants and Nurse Practitioners) who all work together to provide you with the care you need, when you need it.  We recommend signing up for the patient portal called "MyChart".  Sign up information is provided on this After Visit Summary.  MyChart is used to connect with patients for Virtual Visits (Telemedicine).  Patients are able to view lab/test results, encounter notes, upcoming appointments, etc.  Non-urgent messages can be sent to your provider as well.   To learn more about what you can do with MyChart, go to [email or web address].    Your next appointment:   9-12 month(s)  Provider:   Nanetta Batty, MD  or Edd Fabian, FNP      Other Instructions Isaac Stewart recommends that you  continue to stay as active as possible.   Adopting a Healthy Lifestyle.   Know what a healthy weight is for you (roughly BMI <25) and aim to maintain this   Aim for 7+ servings of fruits and vegetables daily   65-80+ fluid ounces of water or unsweet tea for healthy kidneys   Limit to max 1 drink of alcohol per day; avoid smoking/tobacco   Limit animal fats in diet for cholesterol and heart health - choose grass fed whenever available   Avoid highly processed foods, and foods high in saturated/trans fats   Aim for low stress - take time to unwind and care for your mental health   Aim for 150 min of moderate intensity exercise weekly for heart health, and weights twice weekly for bone health   Aim for 7-9 hours of sleep daily   When it comes to diets, agreement about the perfect plan isnt easy to find, even among the experts. Experts at the [facility] [employer] developed an idea known as the Healthy Eating Plate. Just imagine a plate divided into logical, healthy portions.   The emphasis is on diet quality:   Load up on vegetables and fruits - one-half of your plate: Aim for color and variety, and remember that potatoes dont count.   Go for whole grains - one-quarter of your plate: Whole wheat, barley, wheat berries, quinoa, oats, brown rice, and foods made with them. If you want pasta, go with whole wheat pasta.   Protein power - one-quarter of your plate: Fish, chicken, beans, and nuts are all healthy, versatile  protein sources. Limit red meat.   The diet, however, does go beyond the plate, offering a few other suggestions.   Use healthy plant oils, such as olive, canola, soy, corn, sunflower and peanut. Check the labels, and avoid partially hydrogenated oil, which have unhealthy trans fats.   If youre thirsty, drink water. Coffee and tea are good in moderation, but skip sugary drinks and limit milk and dairy products to one or two daily servings.   The type of  carbohydrate in the diet is more important than the amount. Some sources of carbohydrates, such as vegetables, fruits, whole grains, and beans-are healthier than others.   Finally, stay active   [facility] [place]:

## 2023-07-10 ENCOUNTER — Other Ambulatory Visit: Payer: Self-pay | Admitting: Cardiovascular Disease

## 2023-07-10 DIAGNOSIS — I10 Essential (primary) hypertension: Secondary | ICD-10-CM

## 2023-08-29 ENCOUNTER — Encounter (HOSPITAL_BASED_OUTPATIENT_CLINIC_OR_DEPARTMENT_OTHER): Payer: Self-pay | Admitting: Urology

## 2023-08-29 ENCOUNTER — Emergency Department (HOSPITAL_BASED_OUTPATIENT_CLINIC_OR_DEPARTMENT_OTHER): Payer: PPO

## 2023-08-29 ENCOUNTER — Emergency Department (HOSPITAL_BASED_OUTPATIENT_CLINIC_OR_DEPARTMENT_OTHER)
Admission: EM | Admit: 2023-08-29 | Discharge: 2023-08-29 | Disposition: A | Discharge status: Home / Self Care | Payer: PPO | Attending: Emergency Medicine | Admitting: Emergency Medicine

## 2023-08-29 DIAGNOSIS — I10 Essential (primary) hypertension: Secondary | ICD-10-CM | POA: Insufficient documentation

## 2023-08-29 DIAGNOSIS — R110 Nausea: Secondary | ICD-10-CM | POA: Insufficient documentation

## 2023-08-29 DIAGNOSIS — R1032 Left lower quadrant pain: Secondary | ICD-10-CM | POA: Insufficient documentation

## 2023-08-29 DIAGNOSIS — E1165 Type 2 diabetes mellitus with hyperglycemia: Secondary | ICD-10-CM | POA: Diagnosis not present

## 2023-08-29 DIAGNOSIS — Z85038 Personal history of other malignant neoplasm of large intestine: Secondary | ICD-10-CM | POA: Diagnosis not present

## 2023-08-29 DIAGNOSIS — Z79899 Other long term (current) drug therapy: Secondary | ICD-10-CM | POA: Insufficient documentation

## 2023-08-29 DIAGNOSIS — Z794 Long term (current) use of insulin: Secondary | ICD-10-CM | POA: Insufficient documentation

## 2023-08-29 DIAGNOSIS — Z8551 Personal history of malignant neoplasm of bladder: Secondary | ICD-10-CM | POA: Diagnosis not present

## 2023-08-29 DIAGNOSIS — N200 Calculus of kidney: Secondary | ICD-10-CM

## 2023-08-29 LAB — URINALYSIS, ROUTINE W REFLEX MICROSCOPIC
Bilirubin Urine: NEGATIVE
Glucose, UA: >=500 mg/dL — AB
Ketones, ur: NEGATIVE mg/dL
Leukocytes,Ua: NEGATIVE
Nitrite: NEGATIVE
Protein, ur: 30 mg/dL — AB
Specific Gravity, Urine: 1.02 (ref 1.005–1.030)
pH: 5.5 (ref 5.0–8.0)

## 2023-08-29 LAB — CBC
HCT: 36.5 % — ABNORMAL LOW (ref 39.0–52.0)
Hemoglobin: 12 g/dL — ABNORMAL LOW (ref 13.0–17.0)
MCH: 29.4 pg (ref 26.0–34.0)
MCHC: 32.9 g/dL (ref 30.0–36.0)
MCV: 89.5 fL (ref 80.0–100.0)
Platelets: 197 10*3/uL (ref 150–400)
RBC: 4.08 MIL/uL — ABNORMAL LOW (ref 4.22–5.81)
RDW: 13.9 % (ref 11.5–15.5)
WBC: 8.5 10*3/uL (ref 4.0–10.5)
nRBC: 0 % (ref 0.0–0.2)

## 2023-08-29 LAB — URINALYSIS, MICROSCOPIC (REFLEX)

## 2023-08-29 LAB — BASIC METABOLIC PANEL
Anion gap: 10 (ref 5–15)
BUN: 42 mg/dL — ABNORMAL HIGH (ref 8–23)
CO2: 24 mmol/L (ref 22–32)
Calcium: 9 mg/dL (ref 8.9–10.3)
Chloride: 106 mmol/L (ref 98–111)
Creatinine, Ser: 2.94 mg/dL — ABNORMAL HIGH (ref 0.61–1.24)
GFR, Estimated: 21 mL/min — ABNORMAL LOW (ref >60)
Glucose, Bld: 183 mg/dL — ABNORMAL HIGH (ref 70–99)
Potassium: 4.7 mmol/L (ref 3.5–5.1)
Sodium: 140 mmol/L (ref 135–145)

## 2023-08-29 MED ORDER — ONDANSETRON 4 MG PO TBDP: 4.0000 mg | ORAL_TABLET | Freq: Three times a day (TID) | ORAL | 0 refills | Status: AC | PRN

## 2023-08-29 MED ORDER — HYDROCODONE-ACETAMINOPHEN 5-325 MG PO TABS
2.0000 | ORAL_TABLET | ORAL | 0 refills | Status: AC | PRN
Start: 2023-08-29 — End: ?

## 2023-08-29 MED ORDER — SODIUM CHLORIDE 0.9 % IV BOLUS
1000.0000 mL | Freq: Once | INTRAVENOUS | Status: AC
  Administered 2023-08-29: 1000 mL via INTRAVENOUS

## 2023-08-29 NOTE — ED Notes (Signed)
AVS with prescriptions provided to and discussed with patient and family member at bedside. Pt verbalizes understanding of discharge instructions and denies any questions or concerns at this time. Pt has ride home. Pt ambulated out of department independently with steady gait.

## 2023-08-29 NOTE — ED Triage Notes (Signed)
Pt ambulatory to triage Pt states left side flank pain that started this am  Denies any blood in urine or dysuria

## 2023-08-29 NOTE — Discharge Instructions (Addendum)
You were seen in the emergency room today for flank pain.  On CT scan, it showed you had kidney stones.  I have sent Norco and Zofran to your pharmacy you can take as needed.  Please discuss today's emergency room findings with urologist at your appointment on Friday.  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   These return to the emergency room with new or worsening symptoms.

## 2023-08-29 NOTE — ED Notes (Signed)
Patient transported to CT 

## 2023-08-29 NOTE — ED Provider Notes (Signed)
EMERGENCY DEPARTMENT AT [facility] Provider Note   CSN: [id number] Arrival date & time: 08/29/23  1242     History  Chief Complaint  Patient presents with   Flank Pain    Isaac Stewart is a 82 y.o. male w/ pmhx of kidney stones, HTN, HLD, DM, hx of inguinal hernia surgery repair, hx of colon cancer and bladder cancer is presenting with ER with left-sided flank pain that started this morning when he woke up.  Since the pain started the pain is moved from his left flank to his left lower abdomen.  Patient said when the pain was present it was associated with nausea.  When speaking to the patient, pain had completely resolved on its own.  Denies burning when he pees, chills, fever, blood in urine or stool.   Flank Pain       Home Medications Prior to Admission medications   Medication Sig Start Date End Date Taking? Authorizing Provider  APPLE CIDER VINEGAR PO Take 2 tablets by mouth daily with breakfast.     [provider]  atorvastatin (LIPITOR) 80 MG tablet Take 1 tablet (80 mg total) by mouth daily. 06/25/22   Runell Gess, MD  CINNAMON PO Take 2 capsules by mouth daily.     [provider]  cloNIDine (CATAPRES) 0.2 MG tablet TAKE ONE TABLET BY MOUTH THREE TIMES DAILY 07/12/23   Runell Gess, MD  diphenhydrAMINE (BENADRYL) 25 MG tablet Take 50 mg by mouth every morning.     [provider]  doxazosin (CARDURA) 8 MG tablet Take 1 tablet (8 mg total) by mouth daily. Dose increase 12/18/21   Runell Gess, MD  ELDERBERRY PO Take by mouth.    [provider]  empagliflozin (JARDIANCE) 10 MG TABS tablet Take 1 tablet by mouth daily. 03/11/23   [provider]  fenofibrate 160 MG tablet Take 1 tablet (160 mg total) by mouth every morning. 02/11/22   Runell Gess, MD  glimepiride (AMARYL) 4 MG tablet Take 4 mg by mouth daily with breakfast. Take 1 tablet in am and 0.5 tablet at night 07/10/14   [provider]  glucose blood test strip 1 each by Other route 2 (two) times daily. E11.9 10/06/17   [provider]  Insulin Pen Needle 32G X 4 MM MISC Use once daily 07/05/18   [provider]  Multiple Vitamin (MULTIVITAMIN) capsule Take 1 capsule by mouth daily. 01/11/11   [provider]  NIFEdipine (PROCARDIA XL/NIFEDICAL-XL) 90 MG 24 hr tablet Take 90 mg by mouth every morning.  06/22/18   [provider]  TRESIBA FLEXTOUCH 200 UNIT/ML SOPN Inject 56 Units into the skin daily before breakfast.  12/18/19   [provider]      Allergies    Patient has no known allergies.    Review of Systems   Review of Systems  Genitourinary:  Positive for flank pain.    Physical Exam Updated Vital Signs BP 137/68   Pulse (!) 54   Temp 97.7 F (36.5 C) (Oral)   Resp 14   Ht 5\' 11"  (1.803 m)   Wt 99.5 kg   SpO2 98%   BMI 30.59 kg/m  Physical Exam Vitals and nursing note reviewed.  Constitutional:      General: He is not in acute distress.    Appearance: He is not toxic-appearing.  HENT:     Head: Normocephalic and atraumatic.  Eyes:  General: No scleral icterus.    Conjunctiva/sclera: Conjunctivae normal.  Cardiovascular:     Rate and Rhythm: Normal rate and regular rhythm.     Pulses: Normal pulses.     Heart sounds: Normal heart sounds.  Pulmonary:     Effort: Pulmonary effort is normal. No respiratory distress.     Breath sounds: Normal breath sounds.  Abdominal:     General: Abdomen is flat. Bowel sounds are normal. There is no distension.     Palpations: Abdomen is soft.     Tenderness: There is no abdominal tenderness. There is no right CVA tenderness, left CVA tenderness or guarding.  Skin:    General: Skin is warm and dry.     Findings: No lesion.  Neurological:     General: No focal deficit present.     Mental Status: He is alert and oriented to person, place, and time. Mental status is at baseline.     ED Results /  Procedures / Treatments   Labs (all labs ordered are listed, but only abnormal results are displayed) Labs Reviewed  URINALYSIS, ROUTINE W REFLEX MICROSCOPIC - Abnormal; Notable for the following components:      Result Value   Glucose, UA >=500 (*)    Hgb urine dipstick TRACE (*)    Protein, ur 30 (*)    All other components within normal limits  BASIC METABOLIC PANEL - Abnormal; Notable for the following components:   Glucose, Bld 183 (*)    BUN 42 (*)    Creatinine, Ser 2.94 (*)    GFR, Estimated 21 (*)    All other components within normal limits  CBC - Abnormal; Notable for the following components:   RBC 4.08 (*)    Hemoglobin 12.0 (*)    HCT 36.5 (*)    All other components within normal limits  URINALYSIS, MICROSCOPIC (REFLEX) - Abnormal; Notable for the following components:   Bacteria, UA RARE (*)    All other components within normal limits    EKG None  Radiology No results found.  Procedures Procedures    Medications Ordered in ED Medications - No data to display  ED Course/ Medical Decision Making/ A&P                                 Medical Decision Making Amount and/or Complexity of Data Reviewed Labs: ordered. Radiology: ordered.  Risk Prescription drug management.   Isaac Stewart 82 y.o. presented today for abd pain. Working DDx includes, but not limited to, gastroenteritis, colitis, SBO, appendicitis, cholecystitis, hepatobiliary pathology, gastritis, PUD, ACS, dissection, pancreatitis, nephrolithiasis, AAA, UTI, pyelonephritis  R/o DDx: These are considered less likely than current impression due to history of present illness, physical exam, labs/imaging findings.  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  He had associated symptom of nausea.  At this time all of symptoms have resolved.  He has prior history of kidney stones.  No change in bowel or bladder, no blood in the urine.  Vitals are hemodynamically stable. Labs are consistent with prior labs, urine has glucose, but only trace hgb - no sign of UTI.  I ordered no medications, patients sx had resolved upon speaking with him and pain is not reproducible to palpation.  Reevaluation of the patient after these medicines showed that the patient improved Patients vitals assessed. Upon arrival patient is hemodynamically stable.  I have reviewed the patients home medicines and have made adjustments as needed   Plan: Norco and Zofran sent to pharmacy, if sx continue  F/u w/ PCP in 2-3d to ensure resolution of sx.  Patient was given return precautions. Patient stable for discharge at this time.  Patient educated on current sx/dx and verbalized understanding of plan. Return to ER w/ new or worsening sx.          Final Clinical Impression(s) / ED Diagnoses Final diagnoses:  None    Rx / DC Orders ED Discharge Orders     None         Smitty Knudsen, PA-C 08/29/23 1702    Laurence Spates, MD 08/29/23 2348

## 2023-10-13 ENCOUNTER — Other Ambulatory Visit: Payer: Self-pay | Admitting: Cardiovascular Disease

## 2023-10-13 DIAGNOSIS — I10 Essential (primary) hypertension: Secondary | ICD-10-CM

## 2024-04-13 ENCOUNTER — Ambulatory Visit (HOSPITAL_COMMUNITY): Payer: PPO | Attending: Cardiology

## 2024-04-13 DIAGNOSIS — I350 Nonrheumatic aortic (valve) stenosis: Secondary | ICD-10-CM | POA: Insufficient documentation

## 2024-04-13 DIAGNOSIS — I10 Essential (primary) hypertension: Secondary | ICD-10-CM | POA: Insufficient documentation

## 2024-04-13 DIAGNOSIS — I2510 Atherosclerotic heart disease of native coronary artery without angina pectoris: Secondary | ICD-10-CM | POA: Insufficient documentation

## 2024-04-13 DIAGNOSIS — E782 Mixed hyperlipidemia: Secondary | ICD-10-CM | POA: Insufficient documentation

## 2024-04-13 LAB — ECHOCARDIOGRAM COMPLETE
AR max vel: 1.16 cm2
AV Area VTI: 1.26 cm2
AV Area mean vel: 1.09 cm2
AV Mean grad: 16.5 mmHg
AV Peak grad: 31.9 mmHg
Ao pk vel: 2.83 m/s
Area-P 1/2: 2.3 cm2
S' Lateral: 3.1 cm

## 2024-04-16 ENCOUNTER — Ambulatory Visit: Payer: PPO | Attending: Cardiovascular Disease | Admitting: Cardiovascular Disease

## 2024-04-16 VITALS — BP 164/70 | HR 71 | Ht 71.0 in | Wt 225.0 lb

## 2024-04-16 DIAGNOSIS — R931 Abnormal findings on diagnostic imaging of heart and coronary circulation: Secondary | ICD-10-CM | POA: Diagnosis not present

## 2024-04-16 DIAGNOSIS — I10 Essential (primary) hypertension: Secondary | ICD-10-CM | POA: Diagnosis not present

## 2024-04-16 DIAGNOSIS — I350 Nonrheumatic aortic (valve) stenosis: Secondary | ICD-10-CM

## 2024-04-16 DIAGNOSIS — E782 Mixed hyperlipidemia: Secondary | ICD-10-CM | POA: Diagnosis not present

## 2024-04-16 MED ORDER — EZETIMIBE 10 MG PO TABS: 10.0000 mg | ORAL_TABLET | Freq: Every day | ORAL | 3 refills | Status: AC

## 2024-04-16 NOTE — Progress Notes (Signed)
 04/16/2024 Isaac Stewart   1941/11/06  [id number]  Primary Physician Isaac Bray, PA Primary Cardiologist: Isaac Leigh MD Bennye Bravo, [place]  HPI:  Isaac Stewart is a 83 y.o.    mildly overweight married Caucasian male father of 3, grandfather of 4 grandchildren who is wife Isaac Stewart is accompanying him today.  They were both patients of mine in the past but I have not seen him in many years.  They referred by his PCP for a recently auscultated murmur.  I last saw him in the office 04/30/2022.  He is retired from being in Interior and spatial designer.  His risk factors otherwise are notable for treated hypertension, diabetes and hyperlipidemia.  There is no family history for heart disease.  He is never had a heart attack or stroke.  He does complain of some dyspnea more noticeable since he and his wife both had COVID 2 years ago.  He is fairly active and mows his own yard.  His PCP recently auscultated a murmur and referred him here for further evaluation.  Since I saw him in the office 2 years ago he continues to do well.  His 2D echo performed 04/13/2024 is stable with mild to moderate aortic stenosis and a valve area of 1.26 cm, peak gradient of 31.  His coronary calcium  score performed 05/28/2022 was 970 distributed in the left main, LAD and RCA.  He is completely asymptomatic and fairly active.  He camps, fishes and owns a boat.  Current Meds  Medication Sig   APPLE CIDER VINEGAR PO Take 2 tablets by mouth daily with breakfast.    atorvastatin  (LIPITOR) 80 MG tablet Take 1 tablet (80 mg total) by mouth daily.   CINNAMON  PO Take 2 capsules by mouth daily.    cloNIDine  (CATAPRES ) 0.2 MG tablet TAKE ONE TABLET BY MOUTH THREE TIMES DAILY   diphenhydrAMINE  (BENADRYL ) 25 MG tablet Take 50 mg by mouth every morning.    doxazosin  (CARDURA ) 8 MG tablet Take 1 tablet (8 mg total) by mouth daily. Dose increase   ELDERBERRY PO Take by mouth.   empagliflozin (JARDIANCE) 10 MG TABS tablet Take 1 tablet by  mouth daily.   ezetimibe (ZETIA) 10 MG tablet Take 1 tablet (10 mg total) by mouth daily.   fenofibrate  160 MG tablet Take 1 tablet (160 mg total) by mouth every morning.   glimepiride  (AMARYL ) 4 MG tablet Take 4 mg by mouth daily with breakfast. Take 1 tablet in am and 0.5 tablet at night   glucose blood test strip 1 each by Other route 2 (two) times daily. E11.9   HYDROcodone -acetaminophen  (NORCO/VICODIN) 5-325 MG tablet Take 2 tablets by mouth every 4 (four) hours as needed.   Insulin  Pen Needle 32G X 4 MM MISC Use once daily   Multiple Vitamin (MULTIVITAMIN) capsule Take 1 capsule by mouth daily.   NIFEdipine  (PROCARDIA  XL/NIFEDICAL-XL) 90 MG 24 hr tablet Take 90 mg by mouth every morning.    ondansetron  (ZOFRAN -ODT) 4 MG disintegrating tablet Take 1 tablet (4 mg total) by mouth every 8 (eight) hours as needed for nausea or vomiting.   TRESIBA FLEXTOUCH 200 UNIT/ML SOPN Inject 56 Units into the skin daily before breakfast.      No Known Allergies  Social History   Socioeconomic History   Marital status: Married    Spouse name: Not on file   Number of children: 3   Years of education: Not on file   Highest education level:  Not on file  Occupational History   Occupation: semi retired Radio producer  Tobacco Use   Smoking status: Former    Current packs/day: 0.00    Types: Cigarettes    Quit date: 08/06/2001    Years since quitting: 22.7   Smokeless tobacco: Never  Vaping Use   Vaping status: Never Used  Substance and Sexual Activity   Alcohol use: Yes    Comment: occ social   Drug use: Never   Sexual activity: Not on file  Other Topics Concern   Not on file  Social History Narrative   Lives with wife in [place] home that they own.   He is semi retired Radio producer   3 adult children, 2 daughters- one lives in [place] and one lives in [place], one son- lives in [place]   Have grandchildren and great grandchildren that also live in the area   Social Drivers  of Health   Financial Resource Strain: Low Risk  (01/11/2020)   Overall Financial Resource Strain (CARDIA)    Difficulty of Paying Living Expenses: Not hard at all  Food Insecurity: No Food Insecurity (10/30/2020)   Hunger Vital Sign    Worried About Running Out of Food in the Last Year: Never true    Ran Out of Food in the Last Year: Never true  Transportation Needs: No Transportation Needs (01/11/2020)   PRAPARE - Administrator, Civil Service (Medical): No    Lack of Transportation (Non-Medical): No  Physical Activity: Inactive (01/11/2020)   Exercise Vital Sign    Days of Exercise per Week: 0 days    Minutes of Exercise per Session: 0 min  Stress: No Stress Concern Present (01/11/2020)   [employer] of Occupational Health - Occupational Stress Questionnaire    Feeling of Stress : Only a little  Social Connections: Unknown (01/11/2020)   Social Connection and Isolation Panel [NHANES]    Frequency of Communication with Friends and Family: More than three times a week    Frequency of Social Gatherings with Friends and Family: Not on file    Attends Religious Services: Not on file    Active Member of Clubs or Organizations: Not on file    Attends Banker Meetings: Not on file    Marital Status: Married  Intimate Partner Violence: Not on file     Review of Systems: General: negative for chills, fever, night sweats or weight changes.  Cardiovascular: negative for chest pain, dyspnea on exertion, edema, orthopnea, palpitations, paroxysmal nocturnal dyspnea or shortness of breath Dermatological: negative for rash Respiratory: negative for cough or wheezing Urologic: negative for hematuria Abdominal: negative for nausea, vomiting, diarrhea, bright red blood per rectum, melena, or hematemesis Neurologic: negative for visual changes, syncope, or dizziness All other systems reviewed and are otherwise negative except as noted above.    Blood pressure (!)  164/70, pulse 71, height 5\' 11"  (1.803 m), weight 225 lb (102.1 kg), SpO2 96%.  General appearance: alert and no distress Neck: no adenopathy, no carotid bruit, no JVD, supple, symmetrical, trachea midline, and thyroid not enlarged, symmetric, no tenderness/mass/nodules Lungs: clear to auscultation bilaterally Heart: 2/6 outflow tract murmur consistent with aortic stenosis. Extremities: extremities normal, atraumatic, no cyanosis or edema Pulses: 2+ and symmetric Skin: Skin color, texture, turgor normal. No rashes or lesions Neurologic: Grossly normal  EKG EKG Interpretation Date/Time:  Monday Apr 16 2024 08:51:07 EDT Ventricular Rate:  71 PR Interval:  164 QRS Duration:  98 QT Interval:  386 QTC Calculation: 419 R Axis:   -27  Text Interpretation: Normal sinus rhythm Nonspecific T wave abnormality When compared with ECG of 24-Jun-2023 08:00, Nonspecific T wave abnormality, worse in Lateral leads Confirmed by Lauro Portal [phone]) on 04/16/2024 9:09:25 AM    ASSESSMENT AND PLAN:   Mixed hyperlipidemia History of hyperlipidemia with lipid profile performed 3 months ago revealing total cholesterol 162, LDL 100 and HDL 38 on high-dose atorvastatin .  LDL goal less than 70 because of his elevated coronary calcium  score.  I am going to add Zetia 10 mg a day and we will recheck a fasting lipid profile in 3 months.  If he does not achieve goal for secondary prevention we may pursue PCSK9 therapy.  Hypertension, benign History of essential hypertension with blood pressure measured today in the office of 164/70.  He is on clonidine , doxazosin , and nifedipine .  He takes his blood pressure at home and it is usually in the 140/70 range.  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  We will recheck this on annual basis.  Elevated coronary artery calcium  score Coronary calcium  score performed 05/28/2022 was 970 the majority of which was in the left main, LAD and RCA.  He is completely asymptomatic.  He is not at goal for secondary prevention which we will continue to work on.     Isaac Leigh MD [place], [facility] 04/16/2024 9:19 AM

## 2024-04-16 NOTE — Assessment & Plan Note (Signed)
 History of essential hypertension with blood pressure measured today in the office of 164/70.  He is on clonidine , doxazosin , and nifedipine .  He takes his blood pressure at home and it is usually in the 140/70 range.

## 2024-04-16 NOTE — Patient Instructions (Signed)
 Medication Instructions:  Your physician has recommended you make the following change in your medication:   -Start ezetimibe (zetia) 10mg  once daily.  *If you need a refill on your cardiac medications before your next appointment, please call your pharmacy*  Lab Work: Your physician recommends that you return for lab work in: 3 months for FASTING lipid/liver panel.  If you have labs (blood work) drawn today and your tests are completely normal, you will receive your results only by: MyChart Message (if you have MyChart) OR A paper copy in the mail If you have any lab test that is abnormal or we need to change your treatment, we will call you to review the results.  Testing/Procedures: Your physician has requested that you have an echocardiogram. Echocardiography is a painless test that uses sound waves to create images of your heart. It provides your doctor with information about the size and shape of your heart and how well your heart's chambers and valves are working. This procedure takes approximately one hour. There are no restrictions for this procedure. Please do NOT wear cologne, perfume, aftershave, or lotions (deodorant is allowed). Please arrive 15 minutes prior to your appointment time.  Please note: We ask at that you not bring children with you during ultrasound (echo/ vascular) testing. Due to room size and safety concerns, children are not allowed in the ultrasound rooms during exams. Our front office staff cannot provide observation of children in our lobby area while testing is being conducted. An adult accompanying a patient to their appointment will only be allowed in the ultrasound room at the discretion of the ultrasound technician under special circumstances. We apologize for any inconvenience. **To be done in May 2026**   Follow-Up: At [facility], you and your health needs are our priority.  As part of our continuing mission to provide you with exceptional  heart care, our providers are all part of one team.  This team includes your primary Cardiologist (physician) and Advanced Practice Providers or APPs (Physician Assistants and Nurse Practitioners) who all work together to provide you with the care you need, when you need it.  Your next appointment:   12 month(s)  Provider:   Lauro Portal, MD     We recommend signing up for the patient portal called "MyChart".  Sign up information is provided on this After Visit Summary.  MyChart is used to connect with patients for Virtual Visits (Telemedicine).  Patients are able to view lab/test results, encounter notes, upcoming appointments, etc.  Non-urgent messages can be sent to your provider as well.   To learn more about what you can do with MyChart, go to [email or web address].

## 2024-04-16 NOTE — Assessment & Plan Note (Signed)
 History of mild to moderate aortic stenosis with a stable 2D echo most recently performed 04/13/2024 revealing normal LV systolic function, grade 1 diastolic dysfunction with mild to moderate aortic stenosis and a valve area of 1.216 m with a peak gradient of 31 mmHg.  We will recheck this on annual basis.

## 2024-04-16 NOTE — Assessment & Plan Note (Signed)
 History of hyperlipidemia with lipid profile performed 3 months ago revealing total cholesterol 162, LDL 100 and HDL 38 on high-dose atorvastatin .  LDL goal less than 70 because of his elevated coronary calcium  score.  I am going to add Zetia 10 mg a day and we will recheck a fasting lipid profile in 3 months.  If he does not achieve goal for secondary prevention we may pursue PCSK9 therapy.

## 2024-04-16 NOTE — Assessment & Plan Note (Signed)
 Coronary calcium  score performed 05/28/2022 was 970 the majority of which was in the left main, LAD and RCA.  He is completely asymptomatic.  He is not at goal for secondary prevention which we will continue to work on.

## 2024-04-25 ENCOUNTER — Ambulatory Visit: Payer: Self-pay

## 2024-05-08 IMAGING — CT CT CARDIAC CORONARY ARTERY CALCIUM SCORE
2 series · 15 of 20 positions shown, 17 images · non-contrast
Comparison: Chest CT 12/25/2018.
COMPARISON: Chest CT 12/25/2018.

Addendum:
EXAM:
OVER-READ INTERPRETATION  CT CHEST

The following report is a limited chest CT over-read performed by
05/28/2022. The coronary calcium score interpretation by the
cardiologist is attached.
CLINICAL DATA: Cardiovascular Disease Risk stratification
Coronary Calcium Score
TECHNIQUE: A gated, non-contrast computed tomography scan of the heart was
performed using 3mm slice thickness. Axial images were analyzed on a
dedicated workstation. Calcium scoring of the coronary arteries was
performed using the Agatston method.

[Series 2: cascseq 3.0 b35f 70% · axial · 0.39mm/px · z∈[-271,-172]mm · 7 of 51 slices shown]
[im 6/51  vessel]
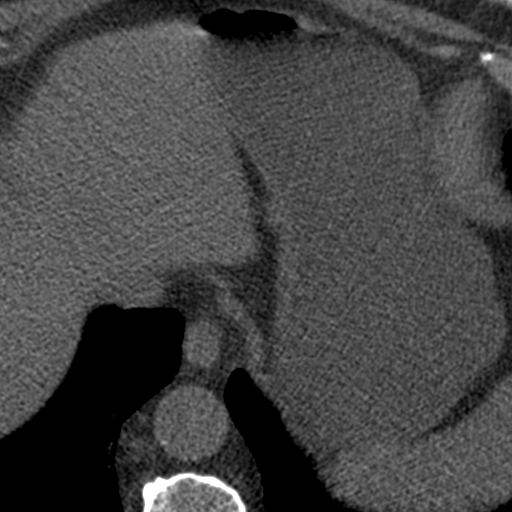
[im 12/51  vessel]
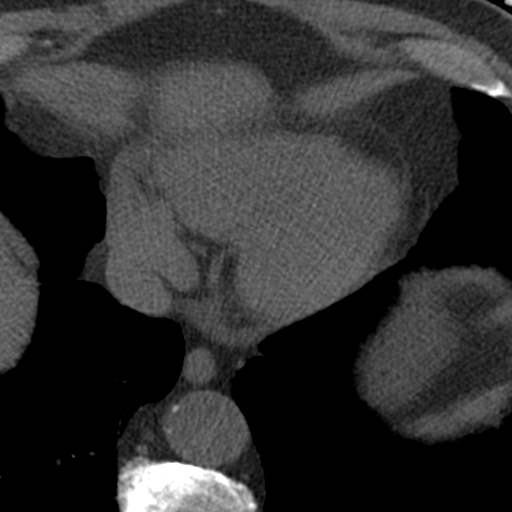
[im 17/51  vessel]
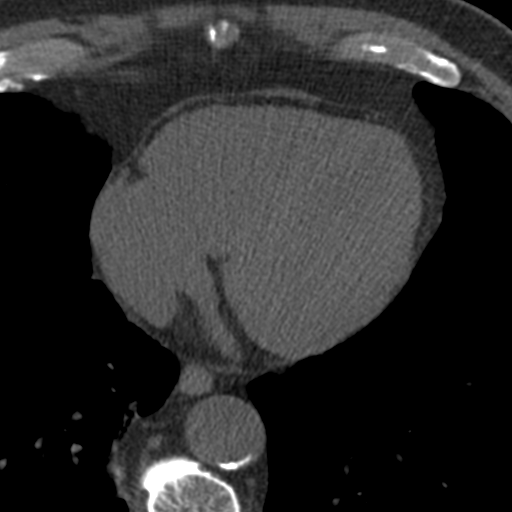
[im 23/51  vessel]
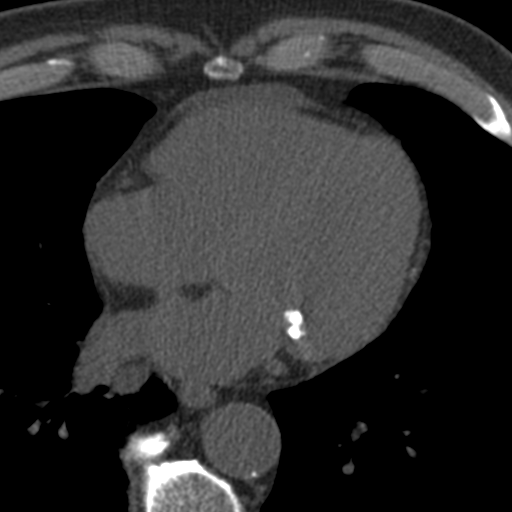
[im 28/51  vessel]
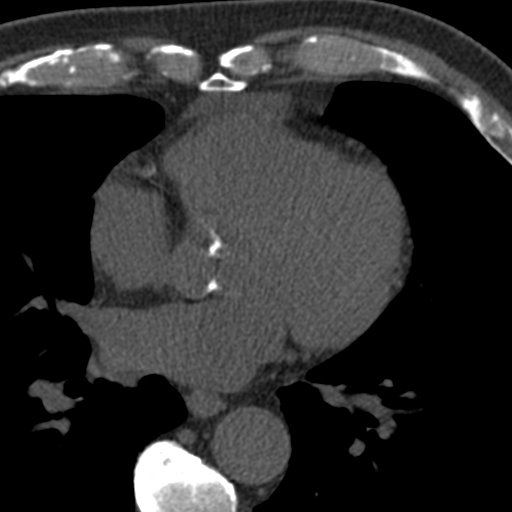
[im 34/51  vessel]
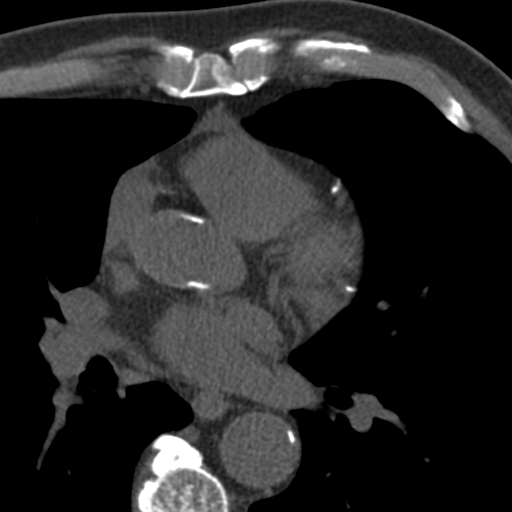
[im 39/51  vessel]
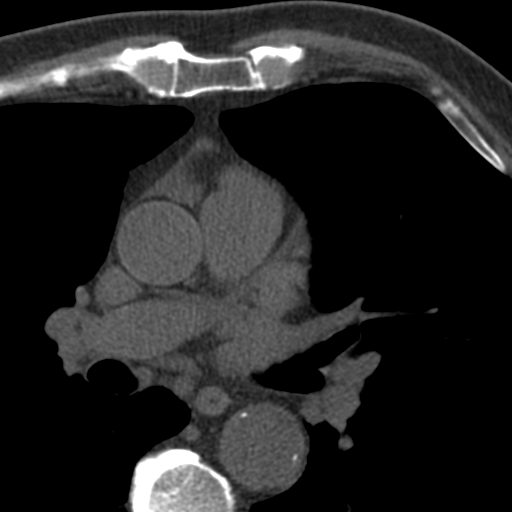

[Series 3: full fov st · axial · 0.65mm/px · z∈[-271,-154]mm · 8 of 51 slices shown, 10 images]
[im 6/51  vessel]
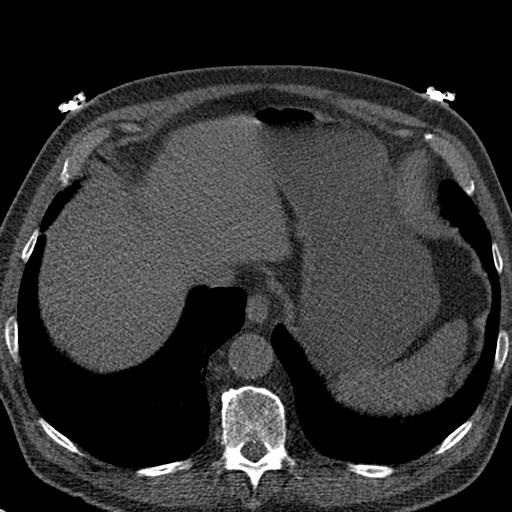
[im 6/51  lung]
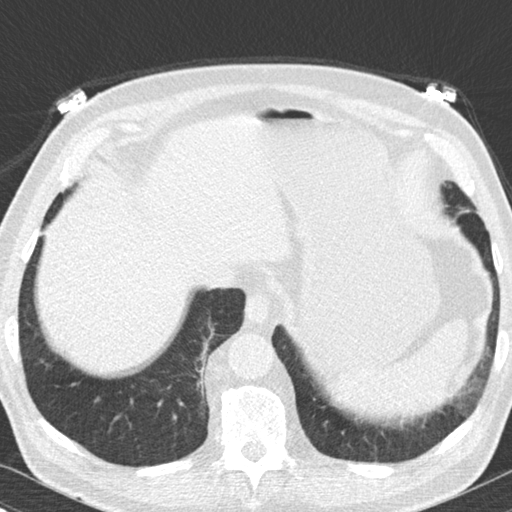
[im 12/51  vessel]
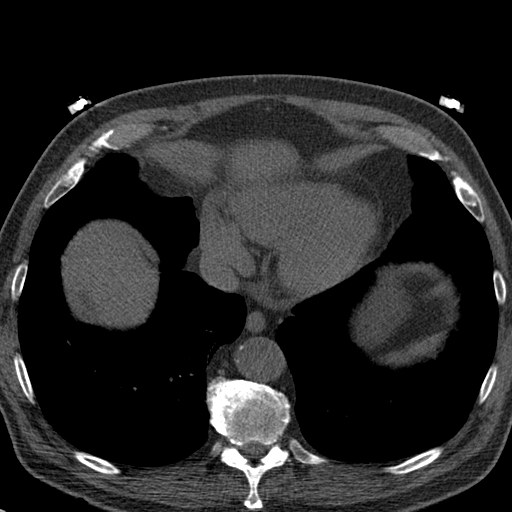
[im 17/51  vessel]
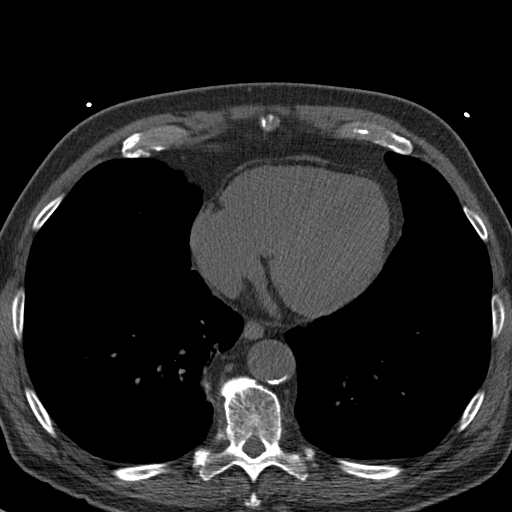
[im 23/51  vessel]
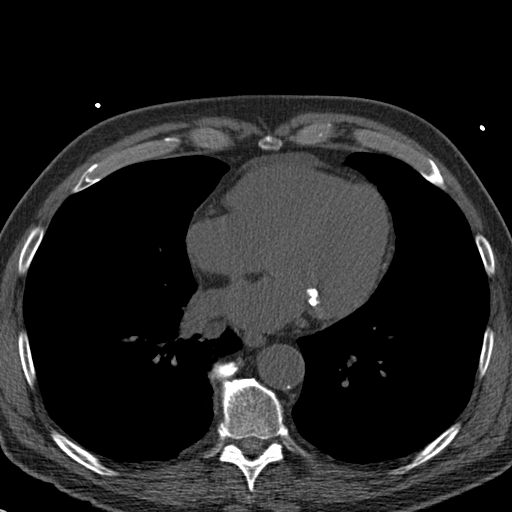
[im 28/51  vessel]
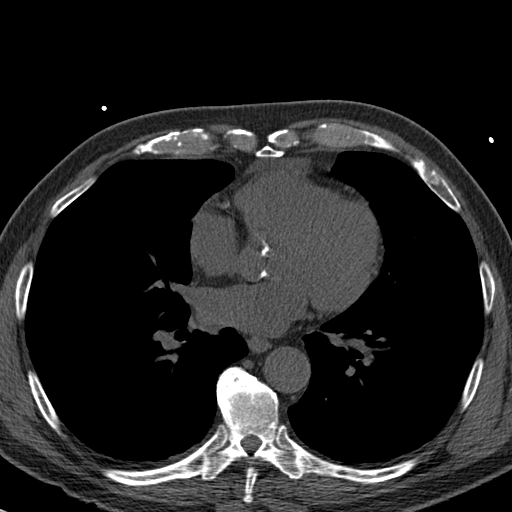
[im 28/51  lung]
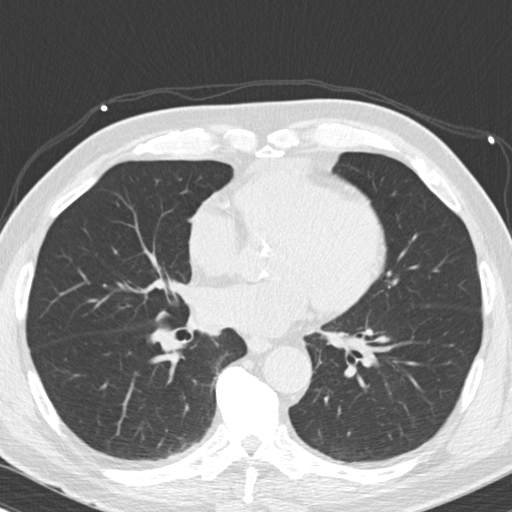
[im 34/51  vessel]
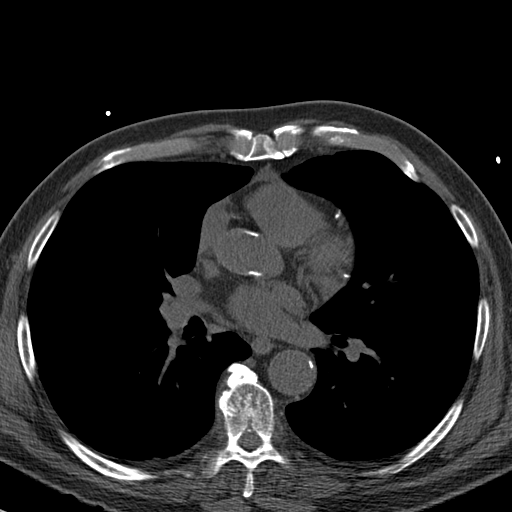
[im 39/51  vessel]
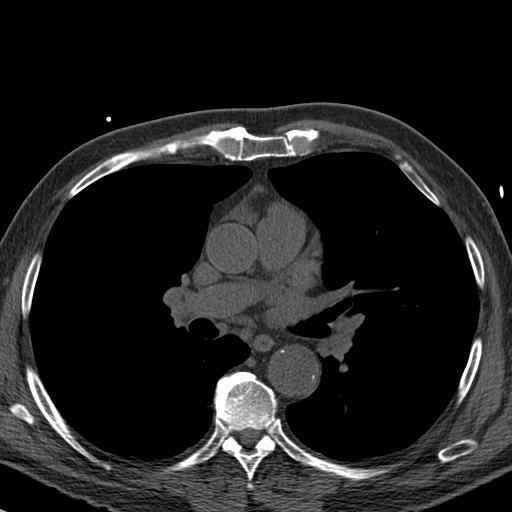
[im 45/51  vessel]
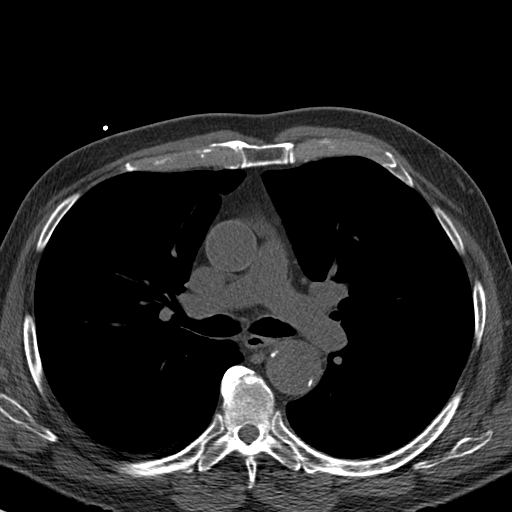

[15 of 20 positions shown; findings below may reference images not displayed]

FINDINGS: Atherosclerotic calcifications throughout the thoracic aorta. Within
the visualized portions of the thorax there are no suspicious
appearing pulmonary nodules or masses, there is no acute
consolidative airspace disease, no pleural effusions, no
pneumothorax and no lymphadenopathy. Visualized portions of the
upper abdomen are unremarkable. There are no aggressive appearing
lytic or blastic lesions noted in the visualized portions of the
skeleton.
IMPRESSION: 1.  Aortic Atherosclerosis (DCHNP-LIV.V).
FINDINGS: Coronary arteries: Normal origins.

Coronary Calcium Score:

Left main: 101

Left anterior descending artery: 687

Left circumflex artery: 0

Right coronary artery: 181

Total: 970

Percentile: 71st

Pericardium: Normal.

Aorta: Normal caliber.  Aortic atherosclerosis.

Aortic valve calcification.  Mitral annular calcification.

Non-cardiac: See separate report from [REDACTED].
IMPRESSION: 1. Coronary calcium score of 970. This was 71st percentile for age-,
race-, and sex-matched controls.
2. Aortic valve and mitral annular calcification.
3. Aortic atherosclerosis.



If CAC=0, it is reasonable to withhold statin therapy and reassess
in 5 to 10 years, as long as higher risk conditions are absent
(diabetes mellitus, family history of premature CHD in first degree
relatives (males <55 years; females <65 years), cigarette smoking,
or LDL >=190 mg/dL).

If CAC is 1 to 99, it is reasonable to initiate statin therapy for
patients >=55 years of age.

If CAC is >=100 or >=75th percentile, it is reasonable to initiate
statin therapy at any age.

Cardiology referral should be considered for patients with CAC
scores >=400 or >=75th percentile.

*1875 AHA/ACC/AACVPR/AAPA/ABC/HUCEIN/ELAHEH/OXENDINE/Franscis/CARLSEN/CHALUISANT/ABDIIN SABA
Guideline on the Management of Blood Cholesterol: A Report of the
American College of Cardiology/American Heart Association Task Force
on Clinical Practice Guidelines. J Am Coll Cardiol.
4387;73(24):1527-1638.

*** End of Addendum ***
EXAM:
OVER-READ INTERPRETATION  CT CHEST

The following report is a limited chest CT over-read performed by
05/28/2022. The coronary calcium score interpretation by the
cardiologist is attached.
FINDINGS: Atherosclerotic calcifications throughout the thoracic aorta. Within
the visualized portions of the thorax there are no suspicious
appearing pulmonary nodules or masses, there is no acute
consolidative airspace disease, no pleural effusions, no
pneumothorax and no lymphadenopathy. Visualized portions of the
upper abdomen are unremarkable. There are no aggressive appearing
lytic or blastic lesions noted in the visualized portions of the
skeleton.
IMPRESSION: 1.  Aortic Atherosclerosis (DCHNP-LIV.V).

## 2024-07-20 ENCOUNTER — Ambulatory Visit: Payer: Self-pay

## 2024-07-20 LAB — LIPID PANEL
Chol/HDL Ratio: 2.9 ratio (ref 0.0–5.0)
Cholesterol, Total: 95 mg/dL — ABNORMAL LOW (ref 100–199)
HDL: 33 mg/dL — ABNORMAL LOW (ref >39)
LDL Chol Calc (NIH): 43 mg/dL (ref 0–99)
Triglycerides: 95 mg/dL (ref 0–149)
VLDL Cholesterol Cal: 19 mg/dL (ref 5–40)

## 2024-07-20 LAB — HEPATIC FUNCTION PANEL
ALT: 19 IU/L (ref 0–44)
AST: 17 IU/L (ref 0–40)
Albumin: 4.4 g/dL (ref 3.7–4.7)
Alkaline Phosphatase: 56 IU/L (ref 44–121)
Bilirubin Total: 0.4 mg/dL (ref 0.0–1.2)
Bilirubin, Direct: 0.16 mg/dL (ref 0.00–0.40)
Total Protein: 6.7 g/dL (ref 6.0–8.5)

## 2024-09-13 ENCOUNTER — Other Ambulatory Visit: Payer: Self-pay | Admitting: General Practice

## 2024-09-13 DIAGNOSIS — I10 Essential (primary) hypertension: Secondary | ICD-10-CM

## 2025-04-15 ENCOUNTER — Ambulatory Visit: Admitting: Cardiovascular Disease
